# Patient Record
Sex: Male | Born: 1945 | Race: Black or African American | Hispanic: No | Marital: Married | Smoking: Former smoker
Health system: Southern US, Community
[De-identification: ages and names within clinical notes are randomized; demographics above are authoritative.]

## PROBLEM LIST (undated history)

## (undated) DIAGNOSIS — Z87891 Personal history of nicotine dependence: Secondary | ICD-10-CM

## (undated) DIAGNOSIS — M1710 Unilateral primary osteoarthritis, unspecified knee: Secondary | ICD-10-CM

## (undated) DIAGNOSIS — N529 Male erectile dysfunction, unspecified: Secondary | ICD-10-CM

## (undated) DIAGNOSIS — E119 Type 2 diabetes mellitus without complications: Secondary | ICD-10-CM

## (undated) DIAGNOSIS — Z860100 Personal history of colon polyps, unspecified: Secondary | ICD-10-CM

## (undated) DIAGNOSIS — J439 Emphysema, unspecified: Secondary | ICD-10-CM

## (undated) DIAGNOSIS — IMO0001 Reserved for inherently not codable concepts without codable children: Secondary | ICD-10-CM

## (undated) DIAGNOSIS — E1169 Type 2 diabetes mellitus with other specified complication: Secondary | ICD-10-CM

## (undated) DIAGNOSIS — Z531 Procedure and treatment not carried out because of patient's decision for reasons of belief and group pressure: Secondary | ICD-10-CM

## (undated) DIAGNOSIS — I2510 Atherosclerotic heart disease of native coronary artery without angina pectoris: Secondary | ICD-10-CM

## (undated) DIAGNOSIS — E785 Hyperlipidemia, unspecified: Secondary | ICD-10-CM

## (undated) DIAGNOSIS — I700 Atherosclerosis of aorta: Secondary | ICD-10-CM

## (undated) HISTORY — DX: Personal history of colonic polyps: Z86.010

## (undated) HISTORY — DX: Atherosclerosis of aorta: I70.0

## (undated) HISTORY — DX: Personal history of colon polyps, unspecified: Z86.0100

## (undated) HISTORY — DX: Personal history of nicotine dependence: Z87.891

## (undated) HISTORY — PX: EYE SURGERY: SHX253

## (undated) HISTORY — DX: Male erectile dysfunction, unspecified: N52.9

## (undated) HISTORY — DX: Hyperlipidemia, unspecified: E78.5

## (undated) HISTORY — DX: Emphysema, unspecified: J43.9

## (undated) HISTORY — DX: Reserved for inherently not codable concepts without codable children: IMO0001

## (undated) HISTORY — DX: Procedure and treatment not carried out because of patient's decision for reasons of belief and group pressure: Z53.1

## (undated) HISTORY — DX: Type 2 diabetes mellitus without complications: E11.9

## (undated) HISTORY — DX: Unilateral primary osteoarthritis, unspecified knee: M17.10

## (undated) HISTORY — DX: Atherosclerotic heart disease of native coronary artery without angina pectoris: I25.10

---

## 2003-11-11 HISTORY — PX: LIPOMA EXCISION: SHX5283

## 2007-11-11 HISTORY — PX: COLONOSCOPY: SHX174

## 2008-05-16 LAB — HM COLONOSCOPY

## 2011-11-11 HISTORY — PX: CARPAL TUNNEL RELEASE: SHX101

## 2012-11-10 HISTORY — PX: FOOT SURGERY: SHX648

## 2013-03-11 LAB — HEPATIC FUNCTION PANEL
ALT: 15 U/L (ref 10–40)
AST: 11 U/L — AB (ref 14–40)

## 2013-03-11 LAB — HEMOGLOBIN A1C: Hgb A1c MFr Bld: 6.7 % — AB (ref 4.0–6.0)

## 2013-11-10 DIAGNOSIS — M179 Osteoarthritis of knee, unspecified: Secondary | ICD-10-CM

## 2013-11-10 DIAGNOSIS — M1710 Unilateral primary osteoarthritis, unspecified knee: Secondary | ICD-10-CM

## 2013-11-10 HISTORY — DX: Unilateral primary osteoarthritis, unspecified knee: M17.10

## 2013-11-10 HISTORY — DX: Osteoarthritis of knee, unspecified: M17.9

## 2013-11-28 LAB — LIPID PANEL
CHOLESTEROL: 136 mg/dL (ref 0–200)
HDL: 44 mg/dL (ref 35–70)
LDL CALC: 74 mg/dL
TRIGLYCERIDES: 89 mg/dL (ref 40–160)

## 2013-11-28 LAB — CBC AND DIFFERENTIAL
HEMOGLOBIN: 14.1 g/dL (ref 13.5–17.5)
WBC: 6.7 10*3/mL

## 2013-11-28 LAB — BASIC METABOLIC PANEL
Creatinine: 1.2 mg/dL (ref <=1.3)
Glucose: 121 mg/dL
Potassium: 4.2 mmol/L (ref 3.4–5.3)

## 2014-07-19 ENCOUNTER — Encounter: Payer: Self-pay | Admitting: Family Medicine

## 2014-07-19 ENCOUNTER — Ambulatory Visit (INDEPENDENT_AMBULATORY_CARE_PROVIDER_SITE_OTHER): Payer: 59 | Admitting: Family Medicine

## 2014-07-19 VITALS — BP 132/78 | HR 68 | Temp 98.2°F | Ht 61.5 in | Wt 195.5 lb

## 2014-07-19 DIAGNOSIS — Z23 Encounter for immunization: Secondary | ICD-10-CM

## 2014-07-19 DIAGNOSIS — N529 Male erectile dysfunction, unspecified: Secondary | ICD-10-CM | POA: Insufficient documentation

## 2014-07-19 DIAGNOSIS — Z860100 Personal history of colon polyps, unspecified: Secondary | ICD-10-CM

## 2014-07-19 DIAGNOSIS — E1169 Type 2 diabetes mellitus with other specified complication: Secondary | ICD-10-CM | POA: Insufficient documentation

## 2014-07-19 DIAGNOSIS — E785 Hyperlipidemia, unspecified: Secondary | ICD-10-CM

## 2014-07-19 MED ORDER — SIMVASTATIN 20 MG PO TABS: 20.0000 mg | ORAL_TABLET | Freq: Every day | ORAL | Status: DC

## 2014-07-19 NOTE — Assessment & Plan Note (Signed)
Requests referral to GI for rpt colonoscopy - placed. ROI filled out for prior colonoscopy report

## 2014-07-19 NOTE — Assessment & Plan Note (Signed)
Doing well on viagra prn. Discussed try 1/2 tab prn.

## 2014-07-19 NOTE — Progress Notes (Signed)
Pre visit review using our clinic review tool, if applicable. No additional management support is needed unless otherwise documented below in the visit note. 

## 2014-07-19 NOTE — Patient Instructions (Signed)
Flu shot today. Good to meet you today, call us with questions. We will refer you for repeat colonoscopy - we will call you in next few weeks to set this up. Return as needed or in 4-5 months for next physical.

## 2014-07-19 NOTE — Assessment & Plan Note (Signed)
Tolerating simvastatin and omega 3 fish oil well - continue. FLP next fasting labwork for physical.

## 2014-07-19 NOTE — Addendum Note (Signed)
Addended by: Royann Shivers A on: 07/19/2014 11:55 AM   Modules accepted: Orders

## 2014-07-19 NOTE — Progress Notes (Signed)
BP 132/78  Pulse 68  Temp(Src) 98.2 F (36.8 C) (Oral)  Ht 5' 1.5" (1.562 m)  Wt 195 lb 8 oz (88.678 kg)  BMI 36.35 kg/m2   CC: new pt to establish  Subjective:    Patient ID: Edward Lester, male    DOB: 04-Oct-1946, 68 y.o.   MRN: [id number]  HPI: [name] is a 68 y.o. male presenting on 07/19/2014 for Establish Care   Prior saw MD in [place] (Dr Cephus Shelling), brings records of latest few visits.  HLD - on krill oil and simvastatin 20mg  nightly. H/o borderline cholesterol elevation. Has been on meds for last 5 years.  ED - viagra 100mg  working well.  Preventative: Colonoscopy - ~2009-2010, due for rpt per patient due to h/o colon polyps. No records of colonoscopy report in records he brings - will request again Flu shot - today Pneumovax - last year (?2 different ones) zostavax 2008 Td shot done unsure when.  Lives with wife Grown children Occupation: Printmaker rep - Education officer, museum Edu: college Activity: golfing Diet: good water, fruits/vegetables daily  Relevant past medical, surgical, family and social history reviewed and updated as indicated.  Allergies and medications reviewed and updated. No current outpatient prescriptions on file prior to visit.   No current facility-administered medications on file prior to visit.    Review of Systems Per HPI unless specifically indicated above    Objective:    BP 132/78  Pulse 68  Temp(Src) 98.2 F (36.8 C) (Oral)  Ht 5' 1.5" (1.562 m)  Wt 195 lb 8 oz (88.678 kg)  BMI 36.35 kg/m2  Physical Exam  Nursing note and vitals reviewed. Constitutional: He is oriented to person, place, and time. He appears well-developed and well-nourished. No distress.  HENT:  Head: Normocephalic and atraumatic.  Mouth/Throat: Uvula is midline, oropharynx is clear and moist and mucous membranes are normal. No oropharyngeal exudate, posterior oropharyngeal edema or posterior oropharyngeal erythema.  Eyes:  Conjunctivae and EOM are normal. Pupils are equal, round, and reactive to light. No scleral icterus.  Neck: Normal range of motion. Neck supple. No thyromegaly present.  Cardiovascular: Normal rate, regular rhythm, normal heart sounds and intact distal pulses.   No murmur heard. Pulses:      Radial pulses are 2+ on the right side, and 2+ on the left side.  Pulmonary/Chest: Effort normal and breath sounds normal. No respiratory distress. He has no wheezes. He has no rales.  Musculoskeletal: Normal range of motion. He exhibits no edema.  Lymphadenopathy:    He has no cervical adenopathy.  Neurological: He is alert and oriented to person, place, and time.  CN grossly intact, station and gait intact  Skin: Skin is warm and dry. No rash noted.  Psychiatric: He has a normal mood and affect. His behavior is normal. Judgment and thought content normal.   No results found for this or any previous visit.    Assessment & Plan:   Problem List Items Addressed This Visit   HLD (hyperlipidemia)     Tolerating simvastatin and omega 3 fish oil well - continue. FLP next fasting labwork for physical.    Relevant Medications      sildenafil (VIAGRA) 100 MG tablet      aspirin (ASPIRIN EC) 81 MG EC tablet      simvastatin (ZOCOR) tablet   History of colonic polyps     Requests referral to GI for rpt colonoscopy - placed. ROI filled out for prior colonoscopy  report    ED (erectile dysfunction) of organic origin     Doing well on viagra prn. Discussed try 1/2 tab prn.     Other Visit Diagnoses   Personal history of colonic polyps    -  Primary    Relevant Orders       Ambulatory referral to Gastroenterology        Follow up plan: Return in about 4 months (around 11/18/2014), or as needed, for physical.

## 2014-07-24 ENCOUNTER — Telehealth: Payer: Self-pay | Admitting: Internal Medicine

## 2014-07-25 ENCOUNTER — Encounter: Payer: Self-pay | Admitting: Family Medicine

## 2014-08-06 ENCOUNTER — Encounter: Payer: Self-pay | Admitting: Family Medicine

## 2014-08-06 LAB — PSA: PSA: 0.7

## 2014-08-14 ENCOUNTER — Encounter: Payer: Self-pay | Admitting: Internal Medicine

## 2014-08-14 NOTE — Telephone Encounter (Signed)
Pt had colon done 05/16/08, report scanned in epic. Per path report there was a hyperplastic polyp removed. Pt wants to know when he will need a recall colon done. Please advise.

## 2014-08-14 NOTE — Telephone Encounter (Signed)
Spoke with pt and he is aware. Pt scheduled to see Dr. Henrene Pastor 10/23/14@10 :15am. Pt aware of appt.

## 2014-08-14 NOTE — Telephone Encounter (Signed)
I don't believe that I have ever seen this patient? What was the followup recommendation from his previous endoscopist ([place])? Based on the limited available information, it looks like 2019. However, I don't know what type of polyps he had on examinations prior 2009. This may influence the followup interval. As well, is he having any symptoms? Does he have a family history? He is welcome to schedule an office appointment to discuss this with me further, if he wishes. Thanks

## 2014-08-14 NOTE — Telephone Encounter (Signed)
New Patient paperwork mailed.Edward Lester

## 2014-09-04 ENCOUNTER — Ambulatory Visit (INDEPENDENT_AMBULATORY_CARE_PROVIDER_SITE_OTHER): Payer: 59 | Admitting: Family Medicine

## 2014-09-04 ENCOUNTER — Encounter: Payer: Self-pay | Admitting: Family Medicine

## 2014-09-04 ENCOUNTER — Ambulatory Visit (INDEPENDENT_AMBULATORY_CARE_PROVIDER_SITE_OTHER)
Admission: RE | Admit: 2014-09-04 | Discharge: 2014-09-04 | Disposition: A | Discharge status: Home / Self Care | Payer: 59 | Source: Ambulatory Visit | Attending: Family Medicine | Admitting: Family Medicine

## 2014-09-04 VITALS — BP 110/70 | HR 68 | Temp 98.0°F | Wt 195.8 lb

## 2014-09-04 DIAGNOSIS — M25561 Pain in right knee: Secondary | ICD-10-CM

## 2014-09-04 DIAGNOSIS — M1711 Unilateral primary osteoarthritis, right knee: Secondary | ICD-10-CM | POA: Insufficient documentation

## 2014-09-04 MED ORDER — NAPROXEN 500 MG PO TABS: ORAL_TABLET | ORAL | Status: DC

## 2014-09-04 NOTE — Progress Notes (Signed)
Pre visit review using our clinic review tool, if applicable. No additional management support is needed unless otherwise documented below in the visit note. 

## 2014-09-04 NOTE — Assessment & Plan Note (Signed)
Anticipate longstanding degenerative arthritis of knee and patellofemoral space leading to intermittent effusion and baker's cyst. rec conservative treatment with NSAID, ice, elevation, brace and exercises from [facility] pt advisor provided today on PFPS. Baseline knee films - R complete and standing bilateral. Update if not improved with above for referral to SM. Pt agrees with plan.

## 2014-09-04 NOTE — Progress Notes (Signed)
   BP 110/70  Pulse 68  Temp(Src) 98 F (36.7 C) (Oral)  Wt 195 lb 12 oz (88.792 kg)   CC: knee pain  Subjective:    Patient ID: Edward Lester, male    DOB: 04-Jun-1946, 68 y.o.   MRN: [id number]  HPI: Edward Lester is a 67 y.o. male presenting on 09/04/2014 for Knee Pain   R knee swelling and pain for last 1 week, now better. Seems to intermittently swell limiting flexion of R knee over last 5-6 yrs. Also feels knot posterior R knee. Currently no pain. No other joint pain. No locking of knee. Denies recent inciting trauma/injury. Ankle doesn't swell, no calf pain.  15 yrs ago had R knee aspirated. No known h/o gout but this runs in the family. Does not describe gout pain. No redness or warmth of right knee.  Actually gave up basketball because of knee pain day after he played.  Relevant past medical, surgical, family and social history reviewed and updated as indicated.  Allergies and medications reviewed and updated. Current Outpatient Prescriptions on File Prior to Visit  Medication Sig  . aspirin (ASPIRIN EC) 81 MG EC tablet Take 81 mg by mouth daily. Swallow whole.  Astrid Drafts Ultra Strength 1500 MG CAPS Take 1,500 mg by mouth daily. MegaRed Omega  . sildenafil (VIAGRA) 100 MG tablet Take 100 mg by mouth as needed for erectile dysfunction.  . simvastatin (ZOCOR) 20 MG tablet Take 1 tablet (20 mg total) by mouth daily at 6 PM.   No current facility-administered medications on file prior to visit.    Review of Systems Per HPI unless specifically indicated above    Objective:    BP 110/70  Pulse 68  Temp(Src) 98 F (36.7 C) (Oral)  Wt 195 lb 12 oz (88.792 kg)  Physical Exam  Nursing note and vitals reviewed. Constitutional: He appears well-developed and well-nourished. No distress.  Musculoskeletal:  Left knee WNL Right Knee exam: No deformity on inspection. No pain with palpation of knee landmarks. Effusion R knee noted FROM in extension without crepitus, limited  flexion. + popliteal fullness. Neg drawer test. Neg mcmurray test. No pain with valgus/varus stress. + PF grind. No abnormal patellar mobility.       Assessment & Plan:   Problem List Items Addressed This Visit   Right knee pain - Primary     Anticipate longstanding degenerative arthritis of knee and patellofemoral space leading to intermittent effusion and baker's cyst. rec conservative treatment with NSAID, ice, elevation, brace and exercises from [facility] pt advisor provided today on PFPS. Baseline knee films - R complete and standing bilateral. Update if not improved with above for referral to SM. Pt agrees with plan.    Relevant Orders      DG Knee Complete 4 Views Right      DG Knee Bilateral Standing AP       Follow up plan: Return if symptoms worsen or fail to improve.

## 2014-09-04 NOTE — Patient Instructions (Addendum)
xrays of R knee today. I think you have some arthritis of knee itself as well as between knee and kneecap leading to fluid accumulation, and a baker's cyst behind the knee. Do exercises provided today for kneecap. We can try naprosyn 500mg  twice daily with food for 5 days whenever acute flare of swelling/pain, otherwise as needed. Ice knee, elevate knee when sitting at home. May use knee brace for further support. Let me know if worsening for referral to sports medicine doctor.

## 2014-09-05 ENCOUNTER — Telehealth: Payer: Self-pay | Admitting: Family Medicine

## 2014-09-05 ENCOUNTER — Encounter: Payer: Self-pay | Admitting: Family Medicine

## 2014-09-05 NOTE — Telephone Encounter (Signed)
Pt returned you call.

## 2014-09-05 NOTE — Telephone Encounter (Signed)
Spoke with patient.

## 2014-10-23 ENCOUNTER — Ambulatory Visit (INDEPENDENT_AMBULATORY_CARE_PROVIDER_SITE_OTHER): Payer: 59 | Admitting: Internal Medicine

## 2014-10-23 ENCOUNTER — Encounter: Payer: Self-pay | Admitting: Internal Medicine

## 2014-10-23 VITALS — BP 142/80 | HR 64 | Ht 70.0 in | Wt 200.4 lb

## 2014-10-23 DIAGNOSIS — Z86010 Personal history of colonic polyps: Secondary | ICD-10-CM

## 2014-10-23 NOTE — Patient Instructions (Signed)
You have a recall colonosocopy scheduled for 05/2018.  You will receive a letter ahead of time to remind you to call and schedule

## 2014-10-23 NOTE — Progress Notes (Signed)
HISTORY OF PRESENT ILLNESS:  Edward Lester is a 68 y.o. male who presents today to discuss the need for colonoscopy. Patient moved to [facility] about one year ago. He informed his primary care provider that he may be due for follow-up colonoscopy. I was able to receive an review outside records from [place] [facility] . Complete colonoscopy was performed 05/16/2008. The examination was complete to the cecum with all landmarks being identified and the colonic preparation good. Examination was normal except for a single diminutive polyp in the distal sigmoid colon which measured 2 mm and was found to be hyperplastic. Patient denies a family history of colon cancer. His GI review of systems is negative. He denies rectal bleeding, change in bowel habits, weight loss, or abdominal pain.  REVIEW OF SYSTEMS:  All non-GI ROS negative upon complete review  Past Medical History  Diagnosis Date  . HLD (hyperlipidemia)   . ED (erectile dysfunction) of organic origin   . History of colonic polyps   . Ex-smoker quit ~2005    30 PY hx  . Osteoarthritis of knee 2015    severe predominantly R  . Refusal of blood transfusions as patient is Jehovah's Witness     Past Surgical History  Procedure Laterality Date  . Foot surgery Left 2014    bone seperation on little toe  . Carpal tunnel release Left 2013  . Lipoma excision  2005    right back  . Colonoscopy  2009    1 hyperplastic polyp [facility])    Social History Edward Lester  reports that he quit smoking about 10 years ago. His smoking use included Cigarettes. He started smoking about 46 years ago. He has a 30 pack-year smoking history. He has never used smokeless tobacco. He reports that he drinks alcohol. He reports that he does not use illicit drugs.  family history includes Lung cancer (age of onset: 24) in his mother; Stomach cancer (age of onset: 95) in his father; Thyroid disease in his sister. There is no history of CAD, Stroke,  or Diabetes.  No Known Allergies     PHYSICAL EXAMINATION: Vital signs: BP 142/80 mmHg  Pulse 64  Ht 5\' 10"  (1.778 m)  Wt 200 lb 6 oz (90.89 kg)  BMI 28.75 kg/m2  Constitutional: generally well-appearing, no acute distress Psychiatric: alert and oriented x3, cooperative Eyes: extraocular movements intact, anicteric, conjunctiva pink Mouth: oral pharynx moist, no lesions Neck: supple no lymphadenopathy Cardiovascular: heart regular rate and rhythm, no murmur Lungs: clear to auscultation bilaterally Abdomen: soft, nontender, nondistended, no obvious ascites, no peritoneal signs, normal bowel sounds, no organomegaly Rectal: Omitted Extremities: no lower extremity edema bilaterally Skin: no lesions on visible extremities Neuro: No focal deficits.   ASSESSMENT:  #1. Index colonoscopy July 2009 with diminutive distal hyperplastic colon polyp only. Adequate complete exam. Baseline risk with no relevant GI symptoms presently   PLAN:  #1. I reviewed with the patient the current guidelines for colonoscopy follow-up intervals. As such, he is appropriate for follow-up July 2019. He understands that and examination may be required sooner for the interval development of relevant clinical issues. Otherwise, he'll return to the care of his primary care provider.

## 2014-11-11 ENCOUNTER — Other Ambulatory Visit: Payer: Self-pay | Admitting: Family Medicine

## 2014-11-11 ENCOUNTER — Encounter: Payer: Self-pay | Admitting: Family Medicine

## 2014-11-11 DIAGNOSIS — E119 Type 2 diabetes mellitus without complications: Secondary | ICD-10-CM

## 2014-11-11 DIAGNOSIS — E1136 Type 2 diabetes mellitus with diabetic cataract: Secondary | ICD-10-CM | POA: Insufficient documentation

## 2014-11-11 DIAGNOSIS — E1139 Type 2 diabetes mellitus with other diabetic ophthalmic complication: Secondary | ICD-10-CM | POA: Insufficient documentation

## 2014-11-11 DIAGNOSIS — E1169 Type 2 diabetes mellitus with other specified complication: Secondary | ICD-10-CM | POA: Insufficient documentation

## 2014-11-11 DIAGNOSIS — Z125 Encounter for screening for malignant neoplasm of prostate: Secondary | ICD-10-CM

## 2014-11-11 DIAGNOSIS — E785 Hyperlipidemia, unspecified: Secondary | ICD-10-CM

## 2014-11-11 HISTORY — DX: Type 2 diabetes mellitus without complications: E11.9

## 2014-11-13 ENCOUNTER — Other Ambulatory Visit (INDEPENDENT_AMBULATORY_CARE_PROVIDER_SITE_OTHER): Payer: 59

## 2014-11-13 DIAGNOSIS — Z125 Encounter for screening for malignant neoplasm of prostate: Secondary | ICD-10-CM

## 2014-11-13 DIAGNOSIS — E119 Type 2 diabetes mellitus without complications: Secondary | ICD-10-CM

## 2014-11-13 DIAGNOSIS — E785 Hyperlipidemia, unspecified: Secondary | ICD-10-CM

## 2014-11-13 LAB — COMPREHENSIVE METABOLIC PANEL
ALK PHOS: 60 U/L (ref 39–117)
ALT: 14 U/L (ref 0–53)
AST: 19 U/L (ref 0–37)
Albumin: 4 g/dL (ref 3.5–5.2)
BUN: 12 mg/dL (ref 6–23)
CHLORIDE: 109 meq/L (ref 96–112)
CO2: 24 mEq/L (ref 19–32)
Calcium: 9.1 mg/dL (ref 8.4–10.5)
Creatinine, Ser: 1 mg/dL (ref 0.4–1.5)
GFR: 94.66 mL/min (ref >60.00)
GLUCOSE: 153 mg/dL — AB (ref 70–99)
Potassium: 4 mEq/L (ref 3.5–5.1)
Sodium: 140 mEq/L (ref 135–145)
Total Bilirubin: 0.5 mg/dL (ref 0.2–1.2)
Total Protein: 7.1 g/dL (ref 6.0–8.3)

## 2014-11-13 LAB — HEMOGLOBIN A1C: Hgb A1c MFr Bld: 6.8 % — ABNORMAL HIGH (ref 4.6–6.5)

## 2014-11-13 LAB — MICROALBUMIN / CREATININE URINE RATIO
CREATININE, U: 236.4 mg/dL
MICROALB UR: 5.5 mg/dL — AB (ref 0.0–1.9)
Microalb Creat Ratio: 2.3 mg/g (ref 0.0–30.0)

## 2014-11-13 LAB — LIPID PANEL
CHOL/HDL RATIO: 4
Cholesterol: 150 mg/dL (ref 0–200)
HDL: 37.5 mg/dL — ABNORMAL LOW (ref >39.00)
LDL Cholesterol: 91 mg/dL (ref 0–99)
NonHDL: 112.5
Triglycerides: 107 mg/dL (ref 0.0–149.0)
VLDL: 21.4 mg/dL (ref 0.0–40.0)

## 2014-11-13 LAB — PSA, MEDICARE: PSA: 1.19 ng/ml (ref 0.10–4.00)

## 2014-11-13 NOTE — Addendum Note (Signed)
Addended by: Royann Shivers A on: 11/13/2014 03:05 PM   Modules accepted: Orders

## 2014-11-20 ENCOUNTER — Encounter: Payer: Self-pay | Admitting: Family Medicine

## 2014-11-20 ENCOUNTER — Ambulatory Visit (INDEPENDENT_AMBULATORY_CARE_PROVIDER_SITE_OTHER): Payer: 59 | Admitting: Family Medicine

## 2014-11-20 VITALS — BP 118/70 | HR 64 | Temp 97.9°F | Ht 70.5 in | Wt 196.2 lb

## 2014-11-20 DIAGNOSIS — Z7189 Other specified counseling: Secondary | ICD-10-CM | POA: Insufficient documentation

## 2014-11-20 DIAGNOSIS — Z0000 Encounter for general adult medical examination without abnormal findings: Secondary | ICD-10-CM | POA: Insufficient documentation

## 2014-11-20 DIAGNOSIS — E119 Type 2 diabetes mellitus without complications: Secondary | ICD-10-CM

## 2014-11-20 DIAGNOSIS — Z1211 Encounter for screening for malignant neoplasm of colon: Secondary | ICD-10-CM

## 2014-11-20 DIAGNOSIS — E785 Hyperlipidemia, unspecified: Secondary | ICD-10-CM

## 2014-11-20 DIAGNOSIS — N529 Male erectile dysfunction, unspecified: Secondary | ICD-10-CM

## 2014-11-20 DIAGNOSIS — M1711 Unilateral primary osteoarthritis, right knee: Secondary | ICD-10-CM

## 2014-11-20 MED ORDER — SILDENAFIL CITRATE 100 MG PO TABS: 100.0000 mg | ORAL_TABLET | ORAL | Status: DC | PRN

## 2014-11-20 MED ORDER — SIMVASTATIN 20 MG PO TABS
20.0000 mg | ORAL_TABLET | Freq: Every day | ORAL | Status: DC
Start: 2014-11-20 — End: 2014-11-20

## 2014-11-20 MED ORDER — SIMVASTATIN 20 MG PO TABS
20.0000 mg | ORAL_TABLET | Freq: Every day | ORAL | Status: DC
Start: 2014-11-20 — End: 2016-12-08

## 2014-11-20 NOTE — Assessment & Plan Note (Signed)
Advanced directive - has at home. Will bring me copy. Wife is HCPOA.

## 2014-11-20 NOTE — Assessment & Plan Note (Signed)
Stable without need for NSAID

## 2014-11-20 NOTE — Progress Notes (Signed)
Pre visit review using our clinic review tool, if applicable. No additional management support is needed unless otherwise documented below in the visit note. 

## 2014-11-20 NOTE — Assessment & Plan Note (Signed)
Refilled viagra

## 2014-11-20 NOTE — Progress Notes (Signed)
BP 118/70 mmHg  Pulse 64  Temp(Src) 97.9 F (36.6 C) (Oral)  Ht 5' 10.5" (1.791 m)  Wt 196 lb 4 oz (89.018 kg)  BMI 27.75 kg/m2   CC: CPE  Subjective:    Patient ID: Edward Lester, male    DOB: 1946/03/11, 69 y.o.   MRN: [id number]  HPI: Edward Lester is a 69 y.o. male presenting on 11/20/2014 for Annual Exam   Preventative: COLONOSCOPY Date: 2009 1 hyperplastic polyp [facility]). Requests iFOB for insurance purposes. Prostate cancer screening - discussed ,would like to screen yearly. Flu shot - 07/2014 Pneumovax - 11/28/2013, prevnar zostavax 2008 Td shot done unsure when.  Advanced directive - has at home. Will bring me copy. Wife is HCPOA.   Lives with wife Grown children Jehova's witness Occupation: Printmaker rep - Education officer, museum Edu: college Activity: golfing Diet: good water, fruits/vegetables daily  Relevant past medical, surgical, family and social history reviewed and updated as indicated. Interim medical history since our last visit reviewed. Allergies and medications reviewed and updated. Current Outpatient Prescriptions on File Prior to Visit  Medication Sig  . aspirin (ASPIRIN EC) 81 MG EC tablet Take 81 mg by mouth daily. Swallow whole.  Astrid Drafts Ultra Strength 1500 MG CAPS Take 1,500 mg by mouth daily. MegaRed Omega   No current facility-administered medications on file prior to visit.    Review of Systems  Constitutional: Negative for fever, chills, activity change, appetite change, fatigue and unexpected weight change.  HENT: Negative for hearing loss.   Eyes: Negative for visual disturbance.  Respiratory: Negative for cough, chest tightness, shortness of breath and wheezing.   Cardiovascular: Negative for chest pain, palpitations and leg swelling.  Gastrointestinal: Negative for nausea, vomiting, abdominal pain, diarrhea, constipation, blood in stool and abdominal distention.  Genitourinary: Negative for hematuria and difficulty  urinating.  Musculoskeletal: Negative for myalgias, arthralgias and neck pain.  Skin: Negative for rash.  Neurological: Negative for dizziness, seizures, syncope and headaches.  Hematological: Negative for adenopathy. Does not bruise/bleed easily.  Psychiatric/Behavioral: Negative for dysphoric mood. The patient is not nervous/anxious.    Per HPI unless specifically indicated above     Objective:    BP 118/70 mmHg  Pulse 64  Temp(Src) 97.9 F (36.6 C) (Oral)  Ht 5' 10.5" (1.791 m)  Wt 196 lb 4 oz (89.018 kg)  BMI 27.75 kg/m2  Wt Readings from Last 3 Encounters:  11/20/14 196 lb 4 oz (89.018 kg)  10/23/14 200 lb 6 oz (90.89 kg)  09/04/14 195 lb 12 oz (88.792 kg)    Physical Exam  Constitutional: He is oriented to person, place, and time. He appears well-developed and well-nourished. No distress.  HENT:  Head: Normocephalic and atraumatic.  Right Ear: Hearing, tympanic membrane, external ear and ear canal normal.  Left Ear: Hearing, tympanic membrane, external ear and ear canal normal.  Nose: Nose normal.  Mouth/Throat: Uvula is midline, oropharynx is clear and moist and mucous membranes are normal. No oropharyngeal exudate, posterior oropharyngeal edema or posterior oropharyngeal erythema.  Eyes: Conjunctivae and EOM are normal. Pupils are equal, round, and reactive to light. No scleral icterus.  Neck: Normal range of motion. Neck supple. Carotid bruit is not present. No thyromegaly present.  Cardiovascular: Normal rate, regular rhythm, normal heart sounds and intact distal pulses.   No murmur heard. Pulses:      Radial pulses are 2+ on the right side, and 2+ on the left side.  Pulmonary/Chest: Effort normal and breath  sounds normal. No respiratory distress. He has no wheezes. He has no rales.  Abdominal: Soft. Bowel sounds are normal. He exhibits no distension and no mass. There is no tenderness. There is no rebound and no guarding.  Genitourinary: Rectum normal and prostate  normal. Rectal exam shows no external hemorrhoid, no internal hemorrhoid, no fissure, no mass, no tenderness and anal tone normal. Prostate is not enlarged (15gm) and not tender.  Musculoskeletal: Normal range of motion. He exhibits no edema.  Lymphadenopathy:    He has no cervical adenopathy.  Neurological: He is alert and oriented to person, place, and time.  CN grossly intact, station and gait intact  Skin: Skin is warm and dry. No rash noted.  Psychiatric: He has a normal mood and affect. His behavior is normal. Judgment and thought content normal.  Nursing note and vitals reviewed.  Results for orders placed or performed in visit on 11/13/14  Lipid panel  Result Value Ref Range   Cholesterol 150 0 - 200 mg/dL   Triglycerides 107.0 0.0 - 149.0 mg/dL   HDL 37.50 (L) >39.00 mg/dL   VLDL 21.4 0.0 - 40.0 mg/dL   LDL Cholesterol 91 0 - 99 mg/dL   Total CHOL/HDL Ratio 4    NonHDL 112.50   Comprehensive metabolic panel  Result Value Ref Range   Sodium 140 135 - 145 mEq/L   Potassium 4.0 3.5 - 5.1 mEq/L   Chloride 109 96 - 112 mEq/L   CO2 24 19 - 32 mEq/L   Glucose, Bld 153 (H) 70 - 99 mg/dL   BUN 12 6 - 23 mg/dL   Creatinine, Ser 1.0 0.4 - 1.5 mg/dL   Total Bilirubin 0.5 0.2 - 1.2 mg/dL   Alkaline Phosphatase 60 39 - 117 U/L   AST 19 0 - 37 U/L   ALT 14 0 - 53 U/L   Total Protein 7.1 6.0 - 8.3 g/dL   Albumin 4.0 3.5 - 5.2 g/dL   Calcium 9.1 8.4 - 10.5 mg/dL   GFR 94.66 >60.00 mL/min  Hemoglobin A1c  Result Value Ref Range   Hgb A1c MFr Bld 6.8 (H) 4.6 - 6.5 %  PSA, Medicare  Result Value Ref Range   PSA 1.19 0.10 - 4.00 ng/ml  Microalbumin / creatinine urine ratio  Result Value Ref Range   Microalb, Ur 5.5 (H) 0.0 - 1.9 mg/dL   Creatinine,U 236.4 mg/dL   Microalb Creat Ratio 2.3 0.0 - 30.0 mg/g      Assessment & Plan:   Problem List Items Addressed This Visit    Osteoarthritis of right knee    Stable without need for NSAID    HLD (hyperlipidemia)    Reviewed with  patient - tolerating simvastatin and fish oil well Continue current regimen.    Relevant Medications      sildenafil (VIAGRA) tablet      simvastatin (ZOCOR) tablet   Health care maintenance - Primary    Preventative protocols reviewed and updated unless pt declined. Discussed healthy diet and lifestyle.     ED (erectile dysfunction) of organic origin    Refilled viagra.    Diabetes mellitus type 2, controlled, without complications    Reviewed diet controlled dm diagnosis - foot exam today  Pt will return next month for prevnar. rtc 6 mo for DM f/u DM in diet handout provided.    Relevant Medications      simvastatin (ZOCOR) tablet   Advanced care planning/counseling discussion    Advanced directive -  has at home. Will bring me copy. Wife is HCPOA.      Other Visit Diagnoses    Special screening for malignant neoplasms, colon        Relevant Orders       Fecal occult blood, imunochemical        Follow up plan: Return in about 6 months (around 05/21/2015), or as needed, for follow up visit.

## 2014-11-20 NOTE — Assessment & Plan Note (Signed)
Preventative protocols reviewed and updated unless pt declined. Discussed healthy diet and lifestyle.  

## 2014-11-20 NOTE — Assessment & Plan Note (Signed)
Reviewed with patient - tolerating simvastatin and fish oil well Continue current regimen.

## 2014-11-20 NOTE — Assessment & Plan Note (Addendum)
Reviewed diet controlled dm diagnosis - foot exam today  Pt will return next month for prevnar. rtc 6 mo for DM f/u DM in diet handout provided.

## 2014-11-20 NOTE — Patient Instructions (Addendum)
Pass by lab to pick up stool kit. Good to see you today, call us with questions. Return next month for prevnar (2nd and final pneumonia shot). Bring me a copy of your living will for your chart. Return as needed or in 6 months for diabetes follow up Watch sugars for diet-controlled diabetes  Diabetes Mellitus and Food It is important for you to manage your blood sugar (glucose) level. Your blood glucose level can be greatly affected by what you eat. Eating healthier foods in the appropriate amounts throughout the day at about the same time each day will help you control your blood glucose level. It can also help slow or prevent worsening of your diabetes mellitus. Healthy eating may even help you improve the level of your blood pressure and reach or maintain a healthy weight.  HOW CAN FOOD AFFECT ME? Carbohydrates Carbohydrates affect your blood glucose level more than any other type of food. Your dietitian will help you determine how many carbohydrates to eat at each meal and teach you how to count carbohydrates. Counting carbohydrates is important to keep your blood glucose at a healthy level, especially if you are using insulin or taking certain medicines for diabetes mellitus. Alcohol Alcohol can cause sudden decreases in blood glucose (hypoglycemia), especially if you use insulin or take certain medicines for diabetes mellitus. Hypoglycemia can be a life-threatening condition. Symptoms of hypoglycemia (sleepiness, dizziness, and disorientation) are similar to symptoms of having too much alcohol.  If your health care provider has given you approval to drink alcohol, do so in moderation and use the following guidelines:  Women should not have more than one drink per day, and men should not have more than two drinks per day. One drink is equal to:  12 oz of beer.  5 oz of wine.  1 oz of hard liquor.  Do not drink on an empty stomach.  Keep yourself hydrated. Have water, diet soda, or  unsweetened iced tea.  Regular soda, juice, and other mixers might contain a lot of carbohydrates and should be counted. WHAT FOODS ARE NOT RECOMMENDED? As you make food choices, it is important to remember that all foods are not the same. Some foods have fewer nutrients per serving than other foods, even though they might have the same number of calories or carbohydrates. It is difficult to get your body what it needs when you eat foods with fewer nutrients. Examples of foods that you should avoid that are high in calories and carbohydrates but low in nutrients include:  Trans fats (most processed foods list trans fats on the Nutrition Facts label).  Regular soda.  Juice.  Candy.  Sweets, such as cake, pie, doughnuts, and cookies.  Fried foods. WHAT FOODS CAN I EAT? Have nutrient-rich foods, which will nourish your body and keep you healthy. The food you should eat also will depend on several factors, including:  The calories you need.  The medicines you take.  Your weight.  Your blood glucose level.  Your blood pressure level.  Your cholesterol level. You also should eat a variety of foods, including:  Protein, such as meat, poultry, fish, tofu, nuts, and seeds (lean animal proteins are best).  Fruits.  Vegetables.  Dairy products, such as milk, cheese, and yogurt (low fat is best).  Breads, grains, pasta, cereal, rice, and beans.  Fats such as olive oil, trans fat-free margarine, canola oil, avocado, and olives. DOES EVERYONE WITH DIABETES MELLITUS HAVE THE SAME MEAL PLAN? Because every person  with diabetes mellitus is different, there is not one meal plan that works for everyone. It is very important that you meet with a dietitian who will help you create a meal plan that is just right for you. Document Released: 07/24/2005 Document Revised: 11/01/2013 Document Reviewed: 09/23/2013 [facility] Patient Information 2015 [place], [place]. This information is not intended  to replace advice given to you by your health care provider. Make sure you discuss any questions you have with your health care provider.

## 2014-11-23 ENCOUNTER — Other Ambulatory Visit: Payer: 59

## 2014-11-23 DIAGNOSIS — Z1211 Encounter for screening for malignant neoplasm of colon: Secondary | ICD-10-CM

## 2014-11-23 LAB — FECAL OCCULT BLOOD, GUAIAC: FECAL OCCULT BLD: NEGATIVE

## 2014-11-23 LAB — FECAL OCCULT BLOOD, IMMUNOCHEMICAL: Fecal Occult Bld: NEGATIVE

## 2014-11-24 ENCOUNTER — Encounter: Payer: Self-pay | Admitting: Family Medicine

## 2014-11-24 ENCOUNTER — Encounter: Payer: Self-pay | Admitting: *Deleted

## 2015-05-09 ENCOUNTER — Other Ambulatory Visit: Payer: Self-pay | Admitting: Family Medicine

## 2015-05-09 ENCOUNTER — Encounter (INDEPENDENT_AMBULATORY_CARE_PROVIDER_SITE_OTHER): Payer: Self-pay

## 2015-05-09 ENCOUNTER — Other Ambulatory Visit (INDEPENDENT_AMBULATORY_CARE_PROVIDER_SITE_OTHER): Payer: 59

## 2015-05-09 DIAGNOSIS — E119 Type 2 diabetes mellitus without complications: Secondary | ICD-10-CM

## 2015-05-09 LAB — BASIC METABOLIC PANEL
BUN: 14 mg/dL (ref 6–23)
CALCIUM: 8.9 mg/dL (ref 8.4–10.5)
CO2: 25 mEq/L (ref 19–32)
Chloride: 105 mEq/L (ref 96–112)
Creatinine, Ser: 1 mg/dL (ref 0.40–1.50)
GFR: 95.61 mL/min (ref >60.00)
GLUCOSE: 115 mg/dL — AB (ref 70–99)
Potassium: 4.1 mEq/L (ref 3.5–5.1)
Sodium: 137 mEq/L (ref 135–145)

## 2015-05-09 LAB — HEMOGLOBIN A1C: Hgb A1c MFr Bld: 6.3 % (ref 4.6–6.5)

## 2015-05-16 ENCOUNTER — Encounter: Payer: Self-pay | Admitting: Family Medicine

## 2015-05-16 ENCOUNTER — Other Ambulatory Visit: Payer: Self-pay | Admitting: Family Medicine

## 2015-05-16 ENCOUNTER — Ambulatory Visit (INDEPENDENT_AMBULATORY_CARE_PROVIDER_SITE_OTHER): Payer: 59 | Admitting: Family Medicine

## 2015-05-16 VITALS — BP 128/82 | HR 68 | Temp 98.2°F | Wt 186.5 lb

## 2015-05-16 DIAGNOSIS — Z1159 Encounter for screening for other viral diseases: Secondary | ICD-10-CM | POA: Diagnosis not present

## 2015-05-16 DIAGNOSIS — E785 Hyperlipidemia, unspecified: Secondary | ICD-10-CM

## 2015-05-16 DIAGNOSIS — Z23 Encounter for immunization: Secondary | ICD-10-CM

## 2015-05-16 DIAGNOSIS — R7303 Prediabetes: Secondary | ICD-10-CM

## 2015-05-16 DIAGNOSIS — R7309 Other abnormal glucose: Secondary | ICD-10-CM

## 2015-05-16 NOTE — Addendum Note (Signed)
Addended by: Ellamae Sia on: 05/16/2015 11:19 AM   Modules accepted: Orders

## 2015-05-16 NOTE — Addendum Note (Signed)
Addended by: Daralene Milch C on: 05/16/2015 10:03 AM   Modules accepted: Orders

## 2015-05-16 NOTE — Assessment & Plan Note (Signed)
Actually with healthy changes and weight loss no longer diabetic - actually prediabetes now. Discussed and congratulated.

## 2015-05-16 NOTE — Progress Notes (Signed)
Pre visit review using our clinic review tool, if applicable. No additional management support is needed unless otherwise documented below in the visit note. 

## 2015-05-16 NOTE — Addendum Note (Signed)
Addended by: Royann Shivers A on: 05/16/2015 10:13 AM   Modules accepted: Orders

## 2015-05-16 NOTE — Assessment & Plan Note (Signed)
Chronic, stable. Continue krill oil and simvastatin.

## 2015-05-16 NOTE — Progress Notes (Signed)
BP 128/82 mmHg  Pulse 68  Temp(Src) 98.2 F (36.8 C) (Oral)  Wt 186 lb 8 oz (84.596 kg)   CC: 6 mo f/u visit  Subjective:    Patient ID: Edward Lester, male    DOB: [birth year], 69 y.o.   MRN: [id number]  HPI: Edward Lester is a 69 y.o. male presenting on 05/16/2015 for Follow-up   Yesterday bp up to 384 systolic. Then dropped to 129. This was after sister aggravated him. Associated with headache and vomiting.   10 lb weight loss - increased exercise and healthy diet changes.  HLD - stable on simvastatin and krill oil.  DM - regularly does not check sugars. Compliant with antihyperglycemic regimen which includes: diet controlled.  Actually now in prediabetes range! Denies hypoglycemic symptoms.  Denies paresthesias. Occasional left pinky numbness. Last diabetic eye exam DUE.  Pneumovax: 2009, 2015.  Prevnar: today. Lab Results  Component Value Date   HGBA1C 6.3 05/09/2015   Diabetic Foot Exam - Simple   Simple Foot Form  Diabetic Foot exam was performed with the following findings:  Yes 05/16/2015  9:49 AM  Visual Inspection  No deformities, no ulcerations, no other skin breakdown bilaterally:  Yes  Sensation Testing  Intact to touch and monofilament testing bilaterally:  Yes  Pulse Check  Posterior Tibialis and Dorsalis pulse intact bilaterally:  Yes  Comments      Requests hep C screen today  Relevant past medical, surgical, family and social history reviewed and updated as indicated. Interim medical history since our last visit reviewed. Allergies and medications reviewed and updated. Current Outpatient Prescriptions on File Prior to Visit  Medication Sig  . aspirin (ASPIRIN EC) 81 MG EC tablet Take 81 mg by mouth daily. Swallow whole.  Astrid Drafts Ultra Strength 1500 MG CAPS Take 1,500 mg by mouth daily. MegaRed Omega  . sildenafil (VIAGRA) 100 MG tablet Take 1 tablet (100 mg total) by mouth as needed for erectile dysfunction.  . simvastatin (ZOCOR) 20 MG tablet Take 1  tablet (20 mg total) by mouth daily at 6 PM.   No current facility-administered medications on file prior to visit.    Review of Systems Per HPI unless specifically indicated above     Objective:    BP 128/82 mmHg  Pulse 68  Temp(Src) 98.2 F (36.8 C) (Oral)  Wt 186 lb 8 oz (84.596 kg)  Wt Readings from Last 3 Encounters:  05/16/15 186 lb 8 oz (84.596 kg)  11/20/14 196 lb 4 oz (89.018 kg)  10/23/14 200 lb 6 oz (90.89 kg)   Body mass index is 26.37 kg/(m^2).  Physical Exam  Constitutional: He appears well-developed and well-nourished. No distress.  HENT:  Head: Normocephalic and atraumatic.  Right Ear: External ear normal.  Left Ear: External ear normal.  Nose: Nose normal.  Mouth/Throat: Oropharynx is clear and moist. No oropharyngeal exudate.  Eyes: Conjunctivae and EOM are normal. Pupils are equal, round, and reactive to light. No scleral icterus.  Neck: Normal range of motion. Neck supple.  Cardiovascular: Normal rate, regular rhythm, normal heart sounds and intact distal pulses.   No murmur heard. Pulmonary/Chest: Effort normal and breath sounds normal. No respiratory distress. He has no wheezes. He has no rales.  Musculoskeletal: He exhibits no edema.  See HPI for foot exam if done  Lymphadenopathy:    He has no cervical adenopathy.  Skin: Skin is warm and dry. No rash noted.  Psychiatric: He has a normal mood and affect.  Nursing note and vitals reviewed.  Results for orders placed or performed in visit on 05/09/15  Hemoglobin A1c  Result Value Ref Range   Hgb A1c MFr Bld 6.3 4.6 - 6.5 %  Basic metabolic panel  Result Value Ref Range   Sodium 137 135 - 145 mEq/L   Potassium 4.1 3.5 - 5.1 mEq/L   Chloride 105 96 - 112 mEq/L   CO2 25 19 - 32 mEq/L   Glucose, Bld 115 (H) 70 - 99 mg/dL   BUN 14 6 - 23 mg/dL   Creatinine, Ser 1.00 0.40 - 1.50 mg/dL   Calcium 8.9 8.4 - 10.5 mg/dL   GFR 95.61 >60.00 mL/min      Assessment & Plan:   Problem List Items  Addressed This Visit    HLD (hyperlipidemia)    Chronic, stable. Continue krill oil and simvastatin.      Prediabetes    Actually with healthy changes and weight loss no longer diabetic - actually prediabetes now. Discussed and congratulated.       Other Visit Diagnoses    Need for hepatitis C screening test    -  Primary    Relevant Orders    Hepatitis C Ab Reflex HCV RNA, QUANT        Follow up plan: Return in about 6 months (around 11/16/2015), or as needed, for annual exam, prior fasting for blood work.

## 2015-05-16 NOTE — Patient Instructions (Addendum)
prevnar today. Hep C screen today. Let's check blood pressures a few times each week and let me know if consistently >140/90.  Return in 6 months for physical.

## 2015-05-17 LAB — HEPATITIS C ANTIBODY: HCV AB: NEGATIVE

## 2015-07-20 ENCOUNTER — Telehealth: Payer: Self-pay

## 2015-07-20 NOTE — Telephone Encounter (Signed)
Pt wants to know if can get biometric form filled out from 05/2015 visit. Pt will bring form to office to see if info requested on form is in pts chart.

## 2015-11-26 ENCOUNTER — Other Ambulatory Visit: Payer: Self-pay | Admitting: Family Medicine

## 2015-11-26 DIAGNOSIS — Z125 Encounter for screening for malignant neoplasm of prostate: Secondary | ICD-10-CM

## 2015-11-26 DIAGNOSIS — E785 Hyperlipidemia, unspecified: Secondary | ICD-10-CM

## 2015-11-26 DIAGNOSIS — R7303 Prediabetes: Secondary | ICD-10-CM

## 2015-11-27 ENCOUNTER — Other Ambulatory Visit (INDEPENDENT_AMBULATORY_CARE_PROVIDER_SITE_OTHER): Payer: 59

## 2015-11-27 DIAGNOSIS — E785 Hyperlipidemia, unspecified: Secondary | ICD-10-CM | POA: Diagnosis not present

## 2015-11-27 DIAGNOSIS — Z125 Encounter for screening for malignant neoplasm of prostate: Secondary | ICD-10-CM

## 2015-11-27 DIAGNOSIS — R7303 Prediabetes: Secondary | ICD-10-CM

## 2015-11-27 LAB — BASIC METABOLIC PANEL
BUN: 11 mg/dL (ref 6–23)
CALCIUM: 8.8 mg/dL (ref 8.4–10.5)
CHLORIDE: 107 meq/L (ref 96–112)
CO2: 25 meq/L (ref 19–32)
CREATININE: 0.94 mg/dL (ref 0.40–1.50)
GFR: 102.52 mL/min (ref >60.00)
Glucose, Bld: 125 mg/dL — ABNORMAL HIGH (ref 70–99)
Potassium: 4.2 mEq/L (ref 3.5–5.1)
Sodium: 140 mEq/L (ref 135–145)

## 2015-11-27 LAB — LIPID PANEL
CHOLESTEROL: 163 mg/dL (ref 0–200)
HDL: 39.7 mg/dL (ref >39.00)
LDL Cholesterol: 104 mg/dL — ABNORMAL HIGH (ref 0–99)
NonHDL: 123.45
Total CHOL/HDL Ratio: 4
Triglycerides: 99 mg/dL (ref 0.0–149.0)
VLDL: 19.8 mg/dL (ref 0.0–40.0)

## 2015-11-27 LAB — HEMOGLOBIN A1C: Hgb A1c MFr Bld: 6.5 % (ref 4.6–6.5)

## 2015-11-27 LAB — PSA, MEDICARE: PSA: 0.76 ng/ml (ref 0.10–4.00)

## 2015-11-28 ENCOUNTER — Other Ambulatory Visit: Payer: 59

## 2015-12-03 ENCOUNTER — Ambulatory Visit (INDEPENDENT_AMBULATORY_CARE_PROVIDER_SITE_OTHER): Payer: 59 | Admitting: Family Medicine

## 2015-12-03 ENCOUNTER — Encounter: Payer: Self-pay | Admitting: Family Medicine

## 2015-12-03 VITALS — BP 136/84 | HR 64 | Temp 98.3°F | Wt 191.5 lb

## 2015-12-03 DIAGNOSIS — E119 Type 2 diabetes mellitus without complications: Secondary | ICD-10-CM | POA: Diagnosis not present

## 2015-12-03 DIAGNOSIS — Z0000 Encounter for general adult medical examination without abnormal findings: Secondary | ICD-10-CM

## 2015-12-03 DIAGNOSIS — Z23 Encounter for immunization: Secondary | ICD-10-CM | POA: Diagnosis not present

## 2015-12-03 DIAGNOSIS — E785 Hyperlipidemia, unspecified: Secondary | ICD-10-CM

## 2015-12-03 DIAGNOSIS — Z7189 Other specified counseling: Secondary | ICD-10-CM

## 2015-12-03 LAB — MICROALBUMIN / CREATININE URINE RATIO
Creatinine,U: 136 mg/dL
MICROALB UR: 0.8 mg/dL (ref 0.0–1.9)
Microalb Creat Ratio: 0.6 mg/g (ref 0.0–30.0)

## 2015-12-03 NOTE — Progress Notes (Signed)
Pre visit review using our clinic review tool, if applicable. No additional management support is needed unless otherwise documented below in the visit note. 

## 2015-12-03 NOTE — Progress Notes (Signed)
BP 136/84 mmHg  Pulse 64  Temp(Src) 98.3 F (36.8 C) (Oral)  Wt 191 lb 8 oz (86.864 kg)   CC: medicare wellness  Subjective:    Patient ID: Edward Lester, male    DOB: [birth year], 70 y.o.   MRN: [id number]  HPI: Edward Lester is a 70 y.o. male presenting on 12/03/2015 for Annual Exam   Diabetic Foot Exam - Simple   Simple Foot Form  Diabetic Foot exam was performed with the following findings:  Yes 12/03/2015  2:41 PM  Visual Inspection  No deformities, no ulcerations, no other skin breakdown bilaterally:  Yes  Sensation Testing  Intact to touch and monofilament testing bilaterally:  Yes  Pulse Check  Posterior Tibialis and Dorsalis pulse intact bilaterally:  Yes  Comments  Some skin breakdown between 4th/5th toes L side      Preventative: COLONOSCOPY Date: 2009 1 hyperplastic polyp [facility]). iFOB normal 2017. Prostate cancer screening - discussed ,would like to screen yearly. Mild BPH on exam Flu shot - yearly Td shot done unsure when.  Pneumovax - 11/28/2013, prevnar 05/2015 zostavax 2008 Advanced directive - has at home. Will bring me copy. Wife is HCPOA.  Seat belt use discussed No changing moles on skin. Yearly eye exam pending this month  Lives with wife Grown children Jehova's witness Occupation: Printmaker rep - Education officer, [employer] Edu: college Activity: golfing Diet: good water, fruits/vegetables daily  Relevant past medical, surgical, family and social history reviewed and updated as indicated. Interim medical history since our last visit reviewed. Allergies and medications reviewed and updated. Current Outpatient Prescriptions on File Prior to Visit  Medication Sig  . Astrid Drafts Ultra Strength 1500 MG CAPS Take 1,500 mg by mouth daily. MegaRed Omega  . sildenafil (VIAGRA) 100 MG tablet Take 1 tablet (100 mg total) by mouth as needed for erectile dysfunction.  . simvastatin (ZOCOR) 20 MG tablet Take 1 tablet (20 mg total) by mouth daily at  6 PM.  . aspirin (ASPIRIN EC) 81 MG EC tablet Take 81 mg by mouth daily. Reported on 12/03/2015   No current facility-administered medications on file prior to visit.    Review of Systems  Constitutional: Negative for fever, chills, activity change, appetite change, fatigue and unexpected weight change.  HENT: Positive for congestion. Negative for hearing loss.   Eyes: Negative for visual disturbance.  Respiratory: Positive for cough. Negative for chest tightness, shortness of breath and wheezing.   Cardiovascular: Negative for chest pain, palpitations and leg swelling.  Gastrointestinal: Negative for nausea, vomiting, abdominal pain, diarrhea, constipation, blood in stool and abdominal distention.  Genitourinary: Negative for hematuria and difficulty urinating.  Musculoskeletal: Negative for myalgias, arthralgias and neck pain.  Skin: Negative for rash.  Neurological: Negative for dizziness, seizures, syncope and headaches.  Hematological: Negative for adenopathy. Does not bruise/bleed easily.  Psychiatric/Behavioral: Negative for dysphoric mood. The patient is not nervous/anxious.    Per HPI unless specifically indicated in ROS section     Objective:    BP 136/84 mmHg  Pulse 64  Temp(Src) 98.3 F (36.8 C) (Oral)  Wt 191 lb 8 oz (86.864 kg)  Wt Readings from Last 3 Encounters:  12/03/15 191 lb 8 oz (86.864 kg)  05/16/15 186 lb 8 oz (84.596 kg)  11/20/14 196 lb 4 oz (89.018 kg)    Physical Exam  Constitutional: He is oriented to person, place, and time. He appears well-developed and well-nourished. No distress.  HENT:  Head: Normocephalic and atraumatic.  Right  Ear: Hearing, tympanic membrane, external ear and ear canal normal.  Left Ear: Hearing, tympanic membrane, external ear and ear canal normal.  Nose: Nose normal.  Mouth/Throat: Uvula is midline, oropharynx is clear and moist and mucous membranes are normal. No oropharyngeal exudate, posterior oropharyngeal edema or  posterior oropharyngeal erythema.  Eyes: Conjunctivae and EOM are normal. Pupils are equal, round, and reactive to light. No scleral icterus.  Neck: Normal range of motion. Neck supple. Carotid bruit is not present. No thyromegaly present.  Cardiovascular: Normal rate, regular rhythm, normal heart sounds and intact distal pulses.   No murmur heard. Pulses:      Radial pulses are 2+ on the right side, and 2+ on the left side.  Pulmonary/Chest: Effort normal and breath sounds normal. No respiratory distress. He has no wheezes. He has no rales.  Abdominal: Soft. Bowel sounds are normal. He exhibits no distension and no mass. There is no tenderness. There is no rebound and no guarding.  Genitourinary: Rectum normal. Rectal exam shows no external hemorrhoid, no internal hemorrhoid, no fissure, no mass, no tenderness and anal tone normal. Prostate is enlarged (25gm). Prostate is not tender.  Musculoskeletal: Normal range of motion. He exhibits no edema.  See HPI for foot exam if done  Lymphadenopathy:    He has no cervical adenopathy.  Neurological: He is alert and oriented to person, place, and time.  CN grossly intact, station and gait intact  Skin: Skin is warm and dry. No rash noted.  Psychiatric: He has a normal mood and affect. His behavior is normal. Judgment and thought content normal.  Nursing note and vitals reviewed.  Results for orders placed or performed in visit on 11/27/15  Lipid panel  Result Value Ref Range   Cholesterol 163 0 - 200 mg/dL   Triglycerides 99.0 0.0 - 149.0 mg/dL   HDL 39.70 >39.00 mg/dL   VLDL 19.8 0.0 - 40.0 mg/dL   LDL Cholesterol 104 (H) 0 - 99 mg/dL   Total CHOL/HDL Ratio 4    NonHDL [id number]   Basic metabolic panel  Result Value Ref Range   Sodium 140 135 - 145 mEq/L   Potassium 4.2 3.5 - 5.1 mEq/L   Chloride 107 96 - 112 mEq/L   CO2 25 19 - 32 mEq/L   Glucose, Bld 125 (H) 70 - 99 mg/dL   BUN 11 6 - 23 mg/dL   Creatinine, Ser 0.94 0.40 - 1.50 mg/dL     Calcium 8.8 8.4 - 10.5 mg/dL   GFR 102.52 >60.00 mL/min  Hemoglobin A1c  Result Value Ref Range   Hgb A1c MFr Bld 6.5 4.6 - 6.5 %  PSA, Medicare  Result Value Ref Range   PSA 0.76 0.10 - 4.00 ng/ml      Assessment & Plan:   Problem List Items Addressed This Visit    HLD (hyperlipidemia)    Chronic, stable. Continue current regimen of simvastatin and krill.      Health care maintenance - Primary    Preventative protocols reviewed and updated unless pt declined. Discussed healthy diet and lifestyle.       Diabetes mellitus type 2, diet-controlled ([place])    Foot exam today. Upcoming eye exam. Will refer to diabetes education.      Relevant Orders   Ambulatory referral to diabetic education   Advanced care planning/counseling discussion    Advanced directive - has at home. Will bring me copy. Wife is HCPOA.        Other  Visit Diagnoses    Need for influenza vaccination        Relevant Orders    Flu Vaccine QUAD 36+ mos PF IM (Fluarix & Fluzone Quad PF) (Completed)        Follow up plan: Return in about 1 year (around 12/02/2016), or as needed, for annual exam, prior fasting for blood work.

## 2015-12-03 NOTE — Addendum Note (Signed)
Addended by: Ellamae Sia on: 12/03/2015 02:55 PM   Modules accepted: Orders

## 2015-12-03 NOTE — Patient Instructions (Addendum)
Good to see you today, call us with questions Bring me copy of your living will. Urine check today for protein. Return as needed or in 1 year for next physical. Return for lab visit in 6 months to recheck A1c. We will call you to set up diabetes education.   Health Maintenance, Male A healthy lifestyle and preventative care can promote health and wellness.  Maintain regular health, dental, and eye exams.  Eat a healthy diet. Foods like vegetables, fruits, whole grains, low-fat dairy products, and lean protein foods contain the nutrients you need and are low in calories. Decrease your intake of foods high in solid fats, added sugars, and salt. Get information about a proper diet from your health care provider, if necessary.  Regular physical exercise is one of the most important things you can do for your health. Most adults should get at least 150 minutes of moderate-intensity exercise (any activity that increases your heart rate and causes you to sweat) each week. In addition, most adults need muscle-strengthening exercises on 2 or more days a week.   Maintain a healthy weight. The body mass index (BMI) is a screening tool to identify possible weight problems. It provides an estimate of body fat based on height and weight. Your health care provider can find your BMI and can help you achieve or maintain a healthy weight. For males 20 years and older:  A BMI below 18.5 is considered underweight.  A BMI of 18.5 to 24.9 is normal.  A BMI of 25 to 29.9 is considered overweight.  A BMI of 30 and above is considered obese.  Maintain normal blood lipids and cholesterol by exercising and minimizing your intake of saturated fat. Eat a balanced diet with plenty of fruits and vegetables. Blood tests for lipids and cholesterol should begin at age 33 and be repeated every 5 years. If your lipid or cholesterol levels are high, you are over age 11, or you are at high risk for heart disease, you may need  your cholesterol levels checked more frequently.Ongoing high lipid and cholesterol levels should be treated with medicines if diet and exercise are not working.  If you smoke, find out from your health care provider how to quit. If you do not use tobacco, do not start.  Lung cancer screening is recommended for adults aged 52-80 years who are at high risk for developing lung cancer because of a history of smoking. A yearly low-dose CT scan of the lungs is recommended for people who have at least a 30-pack-year history of smoking and are current smokers or have quit within the past 15 years. A pack year of smoking is smoking an average of 1 pack of cigarettes a day for 1 year (for example, a 30-pack-year history of smoking could mean smoking 1 pack a day for 30 years or 2 packs a day for 15 years). Yearly screening should continue until the smoker has stopped smoking for at least 15 years. Yearly screening should be stopped for people who develop a health problem that would prevent them from having lung cancer treatment.  If you choose to drink alcohol, do not have more than 2 drinks per day. One drink is considered to be 12 oz (360 mL) of beer, 5 oz (150 mL) of wine, or 1.5 oz (45 mL) of liquor.  Avoid the use of street drugs. Do not share needles with anyone. Ask for help if you need support or instructions about stopping the use of drugs.  High blood pressure causes heart disease and increases the risk of stroke. High blood pressure is more likely to develop in:  People who have blood pressure in the end of the normal range (100-139/85-89 mm Hg).  People who are overweight or obese.  People who are African American.  If you are 109-2 years of age, have your blood pressure checked every 3-5 years. If you are 94 years of age or older, have your blood pressure checked every year. You should have your blood pressure measured twice--once when you are at a hospital or clinic, and once when you are not  at a hospital or clinic. Record the average of the two measurements. To check your blood pressure when you are not at a hospital or clinic, you can use:  An automated blood pressure machine at a pharmacy.  A home blood pressure monitor.  If you are 40-82 years old, ask your health care provider if you should take aspirin to prevent heart disease.  Diabetes screening involves taking a blood sample to check your fasting blood sugar level. This should be done once every 3 years after age 32 if you are at a normal weight and without risk factors for diabetes. Testing should be considered at a younger age or be carried out more frequently if you are overweight and have at least 1 risk factor for diabetes.  Colorectal cancer can be detected and often prevented. Most routine colorectal cancer screening begins at the age of 67 and continues through age 78. However, your health care provider may recommend screening at an earlier age if you have risk factors for colon cancer. On a yearly basis, your health care provider may provide home test kits to check for hidden blood in the stool. A small camera at the end of a tube may be used to directly examine the colon (sigmoidoscopy or colonoscopy) to detect the earliest forms of colorectal cancer. Talk to your health care provider about this at age 24 when routine screening begins. A direct exam of the colon should be repeated every 5-10 years through age 30, unless early forms of precancerous polyps or small growths are found.  People who are at an increased risk for hepatitis B should be screened for this virus. You are considered at high risk for hepatitis B if:  You were born in a country where hepatitis B occurs often. Talk with your health care provider about which countries are considered high risk.  Your parents were born in a high-risk country and you have not received a shot to protect against hepatitis B (hepatitis B vaccine).  You have HIV or  AIDS.  You use needles to inject street drugs.  You live with, or have sex with, someone who has hepatitis B.  You are a man who has sex with other men (MSM).  You get hemodialysis treatment.  You take certain medicines for conditions like cancer, organ transplantation, and autoimmune conditions.  Hepatitis C blood testing is recommended for all people born from [birth year] through 1965 and any individual with known risk factors for hepatitis C.  Healthy men should no longer receive prostate-specific antigen (PSA) blood tests as part of routine cancer screening. Talk to your health care provider about prostate cancer screening.  Testicular cancer screening is not recommended for adolescents or adult males who have no symptoms. Screening includes self-exam, a health care provider exam, and other screening tests. Consult with your health care provider about any symptoms you have or any  concerns you have about testicular cancer.  Practice safe sex. Use condoms and avoid high-risk sexual practices to reduce the spread of sexually transmitted infections (STIs).  You should be screened for STIs, including gonorrhea and chlamydia if:  You are sexually active and are younger than 24 years.  You are older than 24 years, and your health care provider tells you that you are at risk for this type of infection.  Your sexual activity has changed since you were last screened, and you are at an increased risk for chlamydia or gonorrhea. Ask your health care provider if you are at risk.  If you are at risk of being infected with HIV, it is recommended that you take a prescription medicine daily to prevent HIV infection. This is called pre-exposure prophylaxis (PrEP). You are considered at risk if:  You are a man who has sex with other men (MSM).  You are a heterosexual man who is sexually active with multiple partners.  You take drugs by injection.  You are sexually active with a partner who has  HIV.  Talk with your health care provider about whether you are at high risk of being infected with HIV. If you choose to begin PrEP, you should first be tested for HIV. You should then be tested every 3 months for as long as you are taking PrEP.  Use sunscreen. Apply sunscreen liberally and repeatedly throughout the day. You should seek shade when your shadow is shorter than you. Protect yourself by wearing long sleeves, pants, a wide-brimmed hat, and sunglasses year round whenever you are outdoors.  Tell your health care provider of new moles or changes in moles, especially if there is a change in shape or color. Also, tell your health care provider if a mole is larger than the size of a pencil eraser.  A one-time screening for abdominal aortic aneurysm (AAA) and surgical repair of large AAAs by ultrasound is recommended for men aged 15-75 years who are current or former smokers.  Stay current with your vaccines (immunizations).   This information is not intended to replace advice given to you by your health care provider. Make sure you discuss any questions you have with your health care provider.   Document Released: 04/24/2008 Document Revised: 11/17/2014 Document Reviewed: 03/24/2011 Elsevier Interactive Patient Education [employer].

## 2015-12-03 NOTE — Assessment & Plan Note (Signed)
Advanced directive - has at home. Will bring me copy. Wife is HCPOA.  

## 2015-12-03 NOTE — Assessment & Plan Note (Signed)
Preventative protocols reviewed and updated unless pt declined. Discussed healthy diet and lifestyle.  

## 2015-12-03 NOTE — Assessment & Plan Note (Signed)
Foot exam today. Upcoming eye exam. Will refer to diabetes education.

## 2015-12-03 NOTE — Assessment & Plan Note (Signed)
Chronic, stable. Continue current regimen of simvastatin and krill.

## 2015-12-06 LAB — HM DIABETES EYE EXAM

## 2015-12-11 ENCOUNTER — Encounter: Payer: Self-pay | Admitting: Family Medicine

## 2016-06-02 ENCOUNTER — Other Ambulatory Visit (INDEPENDENT_AMBULATORY_CARE_PROVIDER_SITE_OTHER): Payer: 59

## 2016-06-02 DIAGNOSIS — E119 Type 2 diabetes mellitus without complications: Secondary | ICD-10-CM

## 2016-06-02 LAB — HEMOGLOBIN A1C: Hgb A1c MFr Bld: 6.6 % — ABNORMAL HIGH (ref 4.6–6.5)

## 2016-07-07 ENCOUNTER — Ambulatory Visit (INDEPENDENT_AMBULATORY_CARE_PROVIDER_SITE_OTHER): Payer: 59

## 2016-07-07 DIAGNOSIS — Z23 Encounter for immunization: Secondary | ICD-10-CM | POA: Diagnosis not present

## 2016-11-20 LAB — HM DIABETES EYE EXAM

## 2016-11-30 ENCOUNTER — Other Ambulatory Visit: Payer: Self-pay | Admitting: Family Medicine

## 2016-11-30 DIAGNOSIS — E785 Hyperlipidemia, unspecified: Secondary | ICD-10-CM

## 2016-11-30 DIAGNOSIS — E119 Type 2 diabetes mellitus without complications: Secondary | ICD-10-CM

## 2016-11-30 DIAGNOSIS — Z125 Encounter for screening for malignant neoplasm of prostate: Secondary | ICD-10-CM

## 2016-12-01 ENCOUNTER — Other Ambulatory Visit (INDEPENDENT_AMBULATORY_CARE_PROVIDER_SITE_OTHER): Payer: 59

## 2016-12-01 DIAGNOSIS — Z125 Encounter for screening for malignant neoplasm of prostate: Secondary | ICD-10-CM

## 2016-12-01 DIAGNOSIS — E119 Type 2 diabetes mellitus without complications: Secondary | ICD-10-CM | POA: Diagnosis not present

## 2016-12-01 DIAGNOSIS — E785 Hyperlipidemia, unspecified: Secondary | ICD-10-CM

## 2016-12-01 LAB — LIPID PANEL
CHOLESTEROL: 138 mg/dL (ref 0–200)
HDL: 39.6 mg/dL (ref >39.00)
LDL Cholesterol: 84 mg/dL (ref 0–99)
NONHDL: 98.61
Total CHOL/HDL Ratio: 3
Triglycerides: 74 mg/dL (ref 0.0–149.0)
VLDL: 14.8 mg/dL (ref 0.0–40.0)

## 2016-12-01 LAB — COMPREHENSIVE METABOLIC PANEL
ALBUMIN: 4 g/dL (ref 3.5–5.2)
ALK PHOS: 65 U/L (ref 39–117)
ALT: 14 U/L (ref 0–53)
AST: 17 U/L (ref 0–37)
BUN: 13 mg/dL (ref 6–23)
CO2: 25 mEq/L (ref 19–32)
CREATININE: 0.99 mg/dL (ref 0.40–1.50)
Calcium: 8.8 mg/dL (ref 8.4–10.5)
Chloride: 106 mEq/L (ref 96–112)
GFR: 96.28 mL/min (ref >60.00)
GLUCOSE: 123 mg/dL — AB (ref 70–99)
Potassium: 4 mEq/L (ref 3.5–5.1)
SODIUM: 139 meq/L (ref 135–145)
Total Bilirubin: 0.6 mg/dL (ref 0.2–1.2)
Total Protein: 7.1 g/dL (ref 6.0–8.3)

## 2016-12-01 LAB — PSA, MEDICARE: PSA: 1.63 ng/mL (ref 0.10–4.00)

## 2016-12-01 LAB — HEMOGLOBIN A1C: HEMOGLOBIN A1C: 6.7 % — AB (ref 4.6–6.5)

## 2016-12-03 NOTE — Addendum Note (Signed)
Addended by: Marchia Bond on: 12/03/2016 04:08 PM   Modules accepted: Orders

## 2016-12-04 LAB — MICROALBUMIN / CREATININE URINE RATIO
Creatinine,U: 217.5 mg/dL
Microalb Creat Ratio: 1.1 mg/g (ref 0.0–30.0)
Microalb, Ur: 2.3 mg/dL — ABNORMAL HIGH (ref 0.0–1.9)

## 2016-12-08 ENCOUNTER — Ambulatory Visit (INDEPENDENT_AMBULATORY_CARE_PROVIDER_SITE_OTHER): Payer: 59 | Admitting: Family Medicine

## 2016-12-08 ENCOUNTER — Encounter: Payer: Self-pay | Admitting: Family Medicine

## 2016-12-08 VITALS — BP 134/82 | HR 68 | Temp 98.3°F | Ht 70.5 in | Wt 195.5 lb

## 2016-12-08 DIAGNOSIS — Z23 Encounter for immunization: Secondary | ICD-10-CM

## 2016-12-08 DIAGNOSIS — Z87891 Personal history of nicotine dependence: Secondary | ICD-10-CM | POA: Diagnosis not present

## 2016-12-08 DIAGNOSIS — M19041 Primary osteoarthritis, right hand: Secondary | ICD-10-CM

## 2016-12-08 DIAGNOSIS — Z86010 Personal history of colonic polyps: Secondary | ICD-10-CM

## 2016-12-08 DIAGNOSIS — E785 Hyperlipidemia, unspecified: Secondary | ICD-10-CM | POA: Diagnosis not present

## 2016-12-08 DIAGNOSIS — Z0000 Encounter for general adult medical examination without abnormal findings: Secondary | ICD-10-CM

## 2016-12-08 DIAGNOSIS — Z7189 Other specified counseling: Secondary | ICD-10-CM

## 2016-12-08 DIAGNOSIS — E119 Type 2 diabetes mellitus without complications: Secondary | ICD-10-CM

## 2016-12-08 MED ORDER — SIMVASTATIN 20 MG PO TABS: 20.0000 mg | ORAL_TABLET | Freq: Every day | ORAL | 3 refills | Status: DC

## 2016-12-08 MED ORDER — SILDENAFIL CITRATE 100 MG PO TABS: 50.0000 mg | ORAL_TABLET | ORAL | 3 refills | Status: DC | PRN

## 2016-12-08 NOTE — Progress Notes (Signed)
Pre visit review using our clinic review tool, if applicable. No additional management support is needed unless otherwise documented below in the visit note. 

## 2016-12-08 NOTE — Assessment & Plan Note (Signed)
Chronic, stable. Continue simvastatin and flax seed oil.

## 2016-12-08 NOTE — Assessment & Plan Note (Signed)
Chronic, stable. Diet controlled.  Foot exam today, eye exam UTD

## 2016-12-08 NOTE — Assessment & Plan Note (Signed)
Anticipate R handed osteoarthritis. Discussed supplements for symptom relief.

## 2016-12-08 NOTE — Assessment & Plan Note (Signed)
Rpt colonoscopy will be due 04/2018

## 2016-12-08 NOTE — Patient Instructions (Addendum)
Tdap today (tetanus and whooping cough).  We will refer you for lung cancer screening CT You are doing well today. Continue healthy diet and lifestyle to keep control of blood sugars.  Return as needed or in 1 year for next physical.  Try lotrimin between toes  Health Maintenance, Male A healthy lifestyle and preventative care can promote health and wellness.  Maintain regular health, dental, and eye exams.  Eat a healthy diet. Foods like vegetables, fruits, whole grains, low-fat dairy products, and lean protein foods contain the nutrients you need and are low in calories. Decrease your intake of foods high in solid fats, added sugars, and salt. Get information about a proper diet from your health care provider, if necessary.  Regular physical exercise is one of the most important things you can do for your health. Most adults should get at least 150 minutes of moderate-intensity exercise (any activity that increases your heart rate and causes you to sweat) each week. In addition, most adults need muscle-strengthening exercises on 2 or more days a week.   Maintain a healthy weight. The body mass index (BMI) is a screening tool to identify possible weight problems. It provides an estimate of body fat based on height and weight. Your health care provider can find your BMI and can help you achieve or maintain a healthy weight. For males 20 years and older:  A BMI below 18.5 is considered underweight.  A BMI of 18.5 to 24.9 is normal.  A BMI of 25 to 29.9 is considered overweight.  A BMI of 30 and above is considered obese.  Maintain normal blood lipids and cholesterol by exercising and minimizing your intake of saturated fat. Eat a balanced diet with plenty of fruits and vegetables. Blood tests for lipids and cholesterol should begin at age 46 and be repeated every 5 years. If your lipid or cholesterol levels are high, you are over age 77, or you are at high risk for heart disease, you may  need your cholesterol levels checked more frequently.Ongoing high lipid and cholesterol levels should be treated with medicines if diet and exercise are not working.  If you smoke, find out from your health care provider how to quit. If you do not use tobacco, do not start.  Lung cancer screening is recommended for adults aged 76-80 years who are at high risk for developing lung cancer because of a history of smoking. A yearly low-dose CT scan of the lungs is recommended for people who have at least a 30-pack-year history of smoking and are current smokers or have quit within the past 15 years. A pack year of smoking is smoking an average of 1 pack of cigarettes a day for 1 year (for example, a 30-pack-year history of smoking could mean smoking 1 pack a day for 30 years or 2 packs a day for 15 years). Yearly screening should continue until the smoker has stopped smoking for at least 15 years. Yearly screening should be stopped for people who develop a health problem that would prevent them from having lung cancer treatment.  If you choose to drink alcohol, do not have more than 2 drinks per day. One drink is considered to be 12 oz (360 mL) of beer, 5 oz (150 mL) of wine, or 1.5 oz (45 mL) of liquor.  Avoid the use of street drugs. Do not share needles with anyone. Ask for help if you need support or instructions about stopping the use of drugs.  High blood  pressure causes heart disease and increases the risk of stroke. High blood pressure is more likely to develop in:  People who have blood pressure in the end of the normal range (100-139/85-89 mm Hg).  People who are overweight or obese.  People who are African American.  If you are 70-33 years of age, have your blood pressure checked every 3-5 years. If you are 13 years of age or older, have your blood pressure checked every year. You should have your blood pressure measured twice-once when you are at a hospital or clinic, and once when you are  not at a hospital or clinic. Record the average of the two measurements. To check your blood pressure when you are not at a hospital or clinic, you can use:  An automated blood pressure machine at a pharmacy.  A home blood pressure monitor.  If you are 68-58 years old, ask your health care provider if you should take aspirin to prevent heart disease.  Diabetes screening involves taking a blood sample to check your fasting blood sugar level. This should be done once every 3 years after age 64 if you are at a normal weight and without risk factors for diabetes. Testing should be considered at a younger age or be carried out more frequently if you are overweight and have at least 1 risk factor for diabetes.  Colorectal cancer can be detected and often prevented. Most routine colorectal cancer screening begins at the age of 75 and continues through age 68. However, your health care provider may recommend screening at an earlier age if you have risk factors for colon cancer. On a yearly basis, your health care provider may provide home test kits to check for hidden blood in the stool. A small camera at the end of a tube may be used to directly examine the colon (sigmoidoscopy or colonoscopy) to detect the earliest forms of colorectal cancer. Talk to your health care provider about this at age 49 when routine screening begins. A direct exam of the colon should be repeated every 5-10 years through age 40, unless early forms of precancerous polyps or small growths are found.  People who are at an increased risk for hepatitis B should be screened for this virus. You are considered at high risk for hepatitis B if:  You were born in a country where hepatitis B occurs often. Talk with your health care provider about which countries are considered high risk.  Your parents were born in a high-risk country and you have not received a shot to protect against hepatitis B (hepatitis B vaccine).  You have HIV or  AIDS.  You use needles to inject street drugs.  You live with, or have sex with, someone who has hepatitis B.  You are a man who has sex with other men (MSM).  You get hemodialysis treatment.  You take certain medicines for conditions like cancer, organ transplantation, and autoimmune conditions.  Hepatitis C blood testing is recommended for all people born from [birth year] through 1965 and any individual with known risk factors for hepatitis C.  Healthy men should no longer receive prostate-specific antigen (PSA) blood tests as part of routine cancer screening. Talk to your health care provider about prostate cancer screening.  Testicular cancer screening is not recommended for adolescents or adult males who have no symptoms. Screening includes self-exam, a health care provider exam, and other screening tests. Consult with your health care provider about any symptoms you have or any concerns you  have about testicular cancer.  Practice safe sex. Use condoms and avoid high-risk sexual practices to reduce the spread of sexually transmitted infections (STIs).  You should be screened for STIs, including gonorrhea and chlamydia if:  You are sexually active and are younger than 24 years.  You are older than 24 years, and your health care provider tells you that you are at risk for this type of infection.  Your sexual activity has changed since you were last screened, and you are at an increased risk for chlamydia or gonorrhea. Ask your health care provider if you are at risk.  If you are at risk of being infected with HIV, it is recommended that you take a prescription medicine daily to prevent HIV infection. This is called pre-exposure prophylaxis (PrEP). You are considered at risk if:  You are a man who has sex with other men (MSM).  You are a heterosexual man who is sexually active with multiple partners.  You take drugs by injection.  You are sexually active with a partner who has  HIV.  Talk with your health care provider about whether you are at high risk of being infected with HIV. If you choose to begin PrEP, you should first be tested for HIV. You should then be tested every 3 months for as long as you are taking PrEP.  Use sunscreen. Apply sunscreen liberally and repeatedly throughout the day. You should seek shade when your shadow is shorter than you. Protect yourself by wearing long sleeves, pants, a wide-brimmed hat, and sunglasses year round whenever you are outdoors.  Tell your health care provider of new moles or changes in moles, especially if there is a change in shape or color. Also, tell your health care provider if a mole is larger than the size of a pencil eraser.  A one-time screening for abdominal aortic aneurysm (AAA) and surgical repair of large AAAs by ultrasound is recommended for men aged 52-75 years who are current or former smokers.  Stay current with your vaccines (immunizations). This information is not intended to replace advice given to you by your health care provider. Make sure you discuss any questions you have with your health care provider. Document Released: 04/24/2008 Document Revised: 11/17/2014 Document Reviewed: 07/31/2015 Elsevier Interactive Patient Education  2017 [employer].

## 2016-12-08 NOTE — Assessment & Plan Note (Signed)
Discussed lung cancer screening CT - pt agrees to referral.

## 2016-12-08 NOTE — Assessment & Plan Note (Addendum)
Advanced directive - brings in today. Delcie Roch (wife) then Thurnell Garbe (friend) are HCPOA.

## 2016-12-08 NOTE — Assessment & Plan Note (Signed)
Preventative protocols reviewed and updated unless pt declined. Discussed healthy diet and lifestyle.  

## 2016-12-08 NOTE — Addendum Note (Signed)
Addended by: Royann Shivers A on: 12/08/2016 11:30 AM   Modules accepted: Orders

## 2016-12-08 NOTE — Progress Notes (Signed)
BP 134/82   Pulse 68   Temp 98.3 F (36.8 C) (Oral)   Ht 5' 10.5" (1.791 m)   Wt 195 lb 8 oz (88.7 kg)   BMI 27.65 kg/m    CC: CPE Subjective:    Patient ID: Edward Lester, male    DOB: [birth year], 71 y.o.   MRN: [id number]  HPI: Edward Lester is a 71 y.o. male presenting on 12/08/2016 for Annual Exam   Some R hand pain in the morning, improves as day progresses. No active synovitis symptoms. No other joints involved. No numbness or weakness of hands. Has started turmeric which has helped as well as anti inflammatory medication.   Walks golf course 3x/wk regularly.  Watches diet - avoids sugars.   Preventative: COLONOSCOPY Date: 2009 1 hyperplastic polyp [facility]). iFOB normal 2017. Wants to rpt colonoscopy 05/2018 Prostate cancer screening - discussed ,would like to screen yearly. Mild BPH on exam Lung cancer screening - quit 2005. 30 PY history - discussed, would like screen Flu shot - yearly Requests updated tetanus (new grandson).  Pneumovax - 11/28/2013, prevnar 05/2015 zostavax 2008 Advanced directive - brings in today. Delcie Roch (wife) then Thurnell Garbe (friend) are HCPOA.  Seat belt use discussed Sunscreen use discussed, no changing moles on skin.  Yearly eye exam  Ex smoker - quit 2005.  Alcohol - social  Lives with wife Grown children Jehova's witness Occupation: Printmaker rep - Education officer, museum Edu: college  Activity: golfing 3x/wk  Diet: good water, fruits/vegetables daily   Relevant past medical, surgical, family and social history reviewed and updated as indicated. Interim medical history since our last visit reviewed. Allergies and medications reviewed and updated. Current Outpatient Prescriptions on File Prior to Visit  Medication Sig  . aspirin (ASPIRIN EC) 81 MG EC tablet Take 81 mg by mouth daily. Reported on 12/03/2015  . Krill Oil Ultra Strength 1500 MG CAPS Take 1,500 mg by mouth daily. MegaRed Omega   No current  facility-administered medications on file prior to visit.     Review of Systems  Constitutional: Negative for activity change, appetite change, chills, fatigue, fever and unexpected weight change.  HENT: Positive for congestion (recovering from recent URI). Negative for hearing loss.   Eyes: Negative for visual disturbance.  Respiratory: Negative for cough, chest tightness, shortness of breath and wheezing.   Cardiovascular: Negative for chest pain, palpitations and leg swelling.  Gastrointestinal: Negative for abdominal distention, abdominal pain, blood in stool, constipation, diarrhea, nausea and vomiting.  Genitourinary: Negative for difficulty urinating and hematuria.  Musculoskeletal: Negative for arthralgias, myalgias and neck pain.  Skin: Negative for rash.  Neurological: Negative for dizziness, seizures, syncope and headaches.  Hematological: Negative for adenopathy. Does not bruise/bleed easily.  Psychiatric/Behavioral: Negative for dysphoric mood. The patient is not nervous/anxious.    Per HPI unless specifically indicated in ROS section     Objective:    BP 134/82   Pulse 68   Temp 98.3 F (36.8 C) (Oral)   Ht 5' 10.5" (1.791 m)   Wt 195 lb 8 oz (88.7 kg)   BMI 27.65 kg/m   Wt Readings from Last 3 Encounters:  12/08/16 195 lb 8 oz (88.7 kg)  12/03/15 191 lb 8 oz (86.9 kg)  05/16/15 186 lb 8 oz (84.6 kg)    Physical Exam  Constitutional: He is oriented to person, place, and time. He appears well-developed and well-nourished. No distress.  HENT:  Head: Normocephalic and atraumatic.  Right Ear: Hearing, tympanic membrane,  external ear and ear canal normal.  Left Ear: Hearing, tympanic membrane, external ear and ear canal normal.  Nose: Nose normal.  Mouth/Throat: Uvula is midline, oropharynx is clear and moist and mucous membranes are normal. No oropharyngeal exudate, posterior oropharyngeal edema or posterior oropharyngeal erythema.  Eyes: Conjunctivae and EOM are  normal. Pupils are equal, round, and reactive to light. No scleral icterus.  Neck: Normal range of motion. Neck supple. Carotid bruit is not present. No thyromegaly present.  Cardiovascular: Normal rate, regular rhythm, normal heart sounds and intact distal pulses.   No murmur heard. Pulses:      Radial pulses are 2+ on the right side, and 2+ on the left side.  Pulmonary/Chest: Effort normal and breath sounds normal. No respiratory distress. He has no wheezes. He has no rales.  Abdominal: Soft. Bowel sounds are normal. He exhibits no distension and no mass. There is no tenderness. There is no rebound and no guarding.  Musculoskeletal: Normal range of motion. He exhibits no edema.  Lymphadenopathy:    He has no cervical adenopathy.  Neurological: He is alert and oriented to person, place, and time.  CN grossly intact, station and gait intact  Skin: Skin is warm and dry. No rash noted.  Psychiatric: He has a normal mood and affect. His behavior is normal. Judgment and thought content normal.  Nursing note and vitals reviewed.  Diabetic Foot Exam - Simple   Simple Foot Form Diabetic Foot exam was performed with the following findings:  Yes 12/08/2016 11:20 AM  Visual Inspection See comments:  Yes Sensation Testing Intact to touch and monofilament testing bilaterally:  Yes Pulse Check Posterior Tibialis and Dorsalis pulse intact bilaterally:  Yes Comments Some skin maceration between toes L foot     Results for orders placed or performed in visit on 12/08/16  HM DIABETES EYE EXAM  Result Value Ref Range   HM Diabetic Eye Exam No Retinopathy No Retinopathy      Assessment & Plan:   Problem List Items Addressed This Visit    Advanced care planning/counseling discussion    Advanced directive - brings in today. Delcie Roch (wife) then Thurnell Garbe (friend) are HCPOA.       Arthritis of right hand    Anticipate R handed osteoarthritis. Discussed supplements for symptom relief.       Diabetes mellitus type 2, diet-controlled (HCC)    Chronic, stable. Diet controlled.  Foot exam today, eye exam UTD      Relevant Medications   simvastatin (ZOCOR) 20 MG tablet   Ex-smoker    Discussed lung cancer screening CT - pt agrees to referral.       Relevant Orders   Ambulatory Referral for [place] care maintenance - Primary    Preventative protocols reviewed and updated unless pt declined. Discussed healthy diet and lifestyle.       History of colonic polyps    Rpt colonoscopy will be due 04/2018      HLD (hyperlipidemia)    Chronic, stable. Continue simvastatin and flax seed oil.       Relevant Medications   sildenafil (VIAGRA) 100 MG tablet   simvastatin (ZOCOR) 20 MG tablet       Follow up plan: Return in about 1 year (around 12/08/2017) for annual exam, prior fasting for blood work.  Ria Bush, MD

## 2016-12-15 ENCOUNTER — Telehealth: Payer: Self-pay | Admitting: *Deleted

## 2016-12-15 DIAGNOSIS — Z87891 Personal history of nicotine dependence: Secondary | ICD-10-CM

## 2016-12-15 NOTE — Telephone Encounter (Signed)
Received referral for initial lung cancer screening scan. Contacted patient and obtained smoking history,(former, quit in 2005, 30 pack year) as well as answering questions related to screening process. Patient denies signs of lung cancer such as weight loss or hemoptysis. Patient denies comorbidity that would prevent curative treatment if lung cancer were found. Patient is tentatively scheduled for shared decision making visit and CT scan on 12/23/16 at 1:30pm, pending insurance approval from business office.

## 2016-12-23 ENCOUNTER — Inpatient Hospital Stay: Payer: 59 | Attending: Oncology | Admitting: Oncology

## 2016-12-23 ENCOUNTER — Ambulatory Visit
Admission: RE | Admit: 2016-12-23 | Discharge: 2016-12-23 | Disposition: A | Discharge status: Home / Self Care | Payer: 59 | Source: Ambulatory Visit | Attending: Oncology | Admitting: Oncology

## 2016-12-23 DIAGNOSIS — Z87891 Personal history of nicotine dependence: Secondary | ICD-10-CM | POA: Diagnosis not present

## 2016-12-23 DIAGNOSIS — I2510 Atherosclerotic heart disease of native coronary artery without angina pectoris: Secondary | ICD-10-CM | POA: Diagnosis not present

## 2016-12-23 DIAGNOSIS — I700 Atherosclerosis of aorta: Secondary | ICD-10-CM | POA: Insufficient documentation

## 2016-12-23 DIAGNOSIS — J439 Emphysema, unspecified: Secondary | ICD-10-CM | POA: Diagnosis not present

## 2016-12-23 DIAGNOSIS — Z122 Encounter for screening for malignant neoplasm of respiratory organs: Secondary | ICD-10-CM

## 2016-12-23 NOTE — Progress Notes (Signed)
In accordance with CMS guidelines, patient has met eligibility criteria including age, absence of signs or symptoms of lung cancer.  Social History  Substance Use Topics  . Smoking status: Former Smoker    Packs/day: 1.00    Years: 30.00    Types: Cigarettes    Start date: 11/11/1967    Quit date: 11/11/2003  . Smokeless tobacco: Never Used  . Alcohol use Yes     Comment: social     A shared decision-making session was conducted prior to the performance of CT scan. This includes one or more decision aids, includes benefits and harms of screening, follow-up diagnostic testing, over-diagnosis, false positive rate, and total radiation exposure.  Counseling on the importance of adherence to annual lung cancer LDCT screening, impact of co-morbidities, and ability or willingness to undergo diagnosis and treatment is imperative for compliance of the program.  Counseling on the importance of continued smoking cessation for former smokers; the importance of smoking cessation for current smokers, and information about tobacco cessation interventions have been given to patient including [place] and [place] quit [place] programs.  Written order for lung cancer screening with LDCT has been given to the patient and any and all questions have been answered to the best of my abilities.   Yearly follow up will be coordinated by Burgess Estelle, Thoracic Navigator.

## 2016-12-26 ENCOUNTER — Encounter: Payer: Self-pay | Admitting: *Deleted

## 2016-12-29 ENCOUNTER — Encounter: Payer: Self-pay | Admitting: Family Medicine

## 2016-12-29 ENCOUNTER — Ambulatory Visit (INDEPENDENT_AMBULATORY_CARE_PROVIDER_SITE_OTHER): Payer: 59 | Admitting: Family Medicine

## 2016-12-29 VITALS — BP 104/70 | HR 65 | Temp 97.7°F | Ht 71.0 in | Wt 194.0 lb

## 2016-12-29 DIAGNOSIS — G5601 Carpal tunnel syndrome, right upper limb: Secondary | ICD-10-CM

## 2016-12-29 DIAGNOSIS — R911 Solitary pulmonary nodule: Secondary | ICD-10-CM | POA: Insufficient documentation

## 2016-12-29 DIAGNOSIS — I2510 Atherosclerotic heart disease of native coronary artery without angina pectoris: Secondary | ICD-10-CM | POA: Diagnosis not present

## 2016-12-29 DIAGNOSIS — J439 Emphysema, unspecified: Secondary | ICD-10-CM | POA: Insufficient documentation

## 2016-12-29 DIAGNOSIS — I2584 Coronary atherosclerosis due to calcified coronary lesion: Secondary | ICD-10-CM | POA: Insufficient documentation

## 2016-12-29 DIAGNOSIS — I700 Atherosclerosis of aorta: Secondary | ICD-10-CM | POA: Diagnosis not present

## 2016-12-29 DIAGNOSIS — J432 Centrilobular emphysema: Secondary | ICD-10-CM | POA: Diagnosis not present

## 2016-12-29 HISTORY — DX: Atherosclerosis of aorta: I70.0

## 2016-12-29 HISTORY — DX: Atherosclerotic heart disease of native coronary artery without angina pectoris: I25.10

## 2016-12-29 HISTORY — DX: Emphysema, unspecified: J43.9

## 2016-12-29 NOTE — Progress Notes (Signed)
BP 104/70   Pulse 65   Temp 97.7 F (36.5 C) (Oral)   Ht 5\' 11"  (1.803 m)   Wt 194 lb (88 kg)   BMI 27.06 kg/m    CC: R hand pain/numbness Subjective:    Patient ID: Edward Lester, male    DOB: 1946/01/06, 71 y.o.   MRN: [id number]  HPI: Edward Lester is a 71 y.o. male presenting on 12/29/2016 for Numbness (right thumb, index and middle digits x3 weeks, especially noted at night, no known injury)   3 wk h/o R hand numbness and paresthesias predominantly noted at 1st -3rd digits. No inciting trauma/injury endorsed. Previously thought hand OA. Turmeric was helpful. Not affecting golf game. It is affecting grip strength. More noticeable at night time - at times wakes him up from sleep. Hasn't tried NSAID.   H/o L hand CTS s/p release ~2013.   Relevant past medical, surgical, family and social history reviewed and updated as indicated. Interim medical history since our last visit reviewed. Allergies and medications reviewed and updated.  Outpatient Medications Prior to Visit  Medication Sig Dispense Refill  . aspirin (ASPIRIN EC) 81 MG EC tablet Take 81 mg by mouth daily. Reported on 12/03/2015    . Krill Oil Ultra Strength 1500 MG CAPS Take 1,500 mg by mouth daily. MegaRed Omega    . sildenafil (VIAGRA) 100 MG tablet Take 0.5-1 tablets (50-100 mg total) by mouth as needed for erectile dysfunction. 30 tablet 3  . simvastatin (ZOCOR) 20 MG tablet Take 1 tablet (20 mg total) by mouth daily at 6 PM. 90 tablet 3   No facility-administered medications prior to visit.      Per HPI unless specifically indicated in ROS section below Review of Systems     Objective:    BP 104/70   Pulse 65   Temp 97.7 F (36.5 C) (Oral)   Ht 5\' 11"  (1.803 m)   Wt 194 lb (88 kg)   BMI 27.06 kg/m   Wt Readings from Last 3 Encounters:  12/29/16 194 lb (88 kg)  12/23/16 191 lb (86.6 kg)  12/08/16 195 lb 8 oz (88.7 kg)    Physical Exam  Constitutional: He is oriented to person, place, and time. He  appears well-developed and well-nourished. No distress.  Musculoskeletal: He exhibits no edema.  2+ rad pulses bilaterally No active synovitis  Neurological: He is alert and oriented to person, place, and time.  Neg tinel Pos phalen Grip strength intact  Skin: Skin is warm and dry. No rash noted.  Psychiatric: He has a normal mood and affect.  Nursing note and vitals reviewed.     Assessment & Plan:   Problem List Items Addressed This Visit    Aortic atherosclerosis ([place])    Reviewed with patient. Recommended optimal chol and blood pressure management.       CAD (coronary artery disease)    Reviewed with patient. Recommended optimal chol and blood pressure management.       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 Discussed etiology and treatment options with patient. rec start with wrist brace at night, NSAID PRN at night. If no better with this, pt will let me know for referral to hand surgery for definitive management. Pt agrees with plan.      Emphysema lung ([place])    Discussed recent CT results with patient - he denies dyspnea, cough, wheezing. Will continue to monitor. Ex smoker.  Follow up plan: Return if symptoms worsen or fail to improve.  Ria Bush, MD

## 2016-12-29 NOTE — Patient Instructions (Addendum)
Lotrimin for toes You do have right sided carpal tunnel syndrome. Treat with wrist brace at night time, may try aleve or advil at night as well to help with any inflammation. If no better with this, send me MyChart message and I will refer you to hand surgery.    Carpal Tunnel Syndrome Carpal tunnel syndrome is a condition that causes pain in your hand and arm. The carpal tunnel is a narrow area located on the palm side of your wrist. Repeated wrist motion or certain diseases may cause swelling within the tunnel. This swelling pinches the main nerve in the wrist (median nerve). What are the causes? This condition may be caused by:  Repeated wrist motions.  Wrist injuries.  Arthritis.  A cyst or tumor in the carpal tunnel.  Fluid buildup during pregnancy. Sometimes the cause of this condition is not known. What increases the risk? This condition is more likely to develop in:  People who have jobs that cause them to repeatedly move their wrists in the same motion, such as Art gallery manager.  Women.  People with certain conditions, such as:  Diabetes.  Obesity.  An underactive thyroid (hypothyroidism).  Kidney failure. What are the signs or symptoms? Symptoms of this condition include:  A tingling feeling in your fingers, especially in your thumb, index, and middle fingers.  Tingling or numbness in your hand.  An aching feeling in your entire arm, especially when your wrist and elbow are bent for long periods of time.  Wrist pain that goes up your arm to your shoulder.  Pain that goes down into your palm or fingers.  A weak feeling in your hands. You may have trouble grabbing and holding items. Your symptoms may feel worse during the night. How is this diagnosed? This condition is diagnosed with a medical history and physical exam. You may also have tests, including:  An electromyogram (EMG). This test measures electrical signals sent by your nerves into the  muscles.  X-rays. How is this treated? Treatment for this condition includes:  Lifestyle changes. It is important to stop doing or modify the activity that caused your condition.  Physical or occupational therapy.  Medicines for pain and inflammation. This may include medicine that is injected into your wrist.  A wrist splint.  Surgery. Follow these instructions at home: If you have a splint:  Wear it as told by your health care provider. Remove it only as told by your health care provider.  Loosen the splint if your fingers become numb and tingle, or if they turn cold and blue.  Keep the splint clean and dry. General instructions  Take over-the-counter and prescription medicines only as told by your health care provider.  Rest your wrist from any activity that may be causing your pain. If your condition is work related, talk to your employer about changes that can be made, such as getting a wrist pad to use while typing.  If directed, apply ice to the painful area:  Put ice in a plastic bag.  Place a towel between your skin and the bag.  Leave the ice on for 20 minutes, 2-3 times per day.  Keep all follow-up visits as told by your health care provider. This is important.  Do any exercises as told by your health care provider, physical therapist, or occupational therapist. Contact a health care provider if:  You have new symptoms.  Your pain is not controlled with medicines.  Your symptoms get worse. This information  is not intended to replace advice given to you by your health care provider. Make sure you discuss any questions you have with your health care provider. Document Released: 10/24/2000 Document Revised: 03/06/2016 Document Reviewed: 03/14/2015 Elsevier Interactive Patient Education  2017 [employer].

## 2016-12-29 NOTE — Assessment & Plan Note (Signed)
Reviewed with patient. Recommended optimal chol and blood pressure management.

## 2016-12-29 NOTE — Assessment & Plan Note (Signed)
Story/exam consistent with R CTS. Discussed etiology and treatment options with patient. rec start with wrist brace at night, NSAID PRN at night. If no better with this, pt will let me know for referral to hand surgery for definitive management. Pt agrees with plan.

## 2016-12-29 NOTE — Progress Notes (Signed)
Pre visit review using our clinic review tool, if applicable. No additional management support is needed unless otherwise documented below in the visit note. 

## 2016-12-29 NOTE — Assessment & Plan Note (Signed)
Discussed recent CT results with patient - he denies dyspnea, cough, wheezing. Will continue to monitor. Ex smoker.

## 2017-01-19 ENCOUNTER — Telehealth: Payer: Self-pay | Admitting: *Deleted

## 2017-01-19 DIAGNOSIS — G5601 Carpal tunnel syndrome, right upper limb: Secondary | ICD-10-CM

## 2017-01-19 NOTE — Telephone Encounter (Signed)
CTS no better per Mychart message. Ready for referral.

## 2017-01-19 NOTE — Telephone Encounter (Signed)
PT sent the following message via MyChart. Please advise.   Appointment Request From: Edward Lester    With Provider: Ria Bush, MD [facility] [place]    Preferred Date Range: From 01/27/2017 To 01/30/2017    Preferred Times: Any    Reason for visit: Office Visit    Comments:  Need to see Hand Specialist -Carpal Tunnel issue, no better. Dr. Darnell Level. suggested that I should get hand brace, I did, no improvement. (still having numbness) I shouldn't have to pay office visit for Dr. Darnell Level. , I now need hand specialist.

## 2017-01-20 NOTE — Addendum Note (Signed)
Addended by: Ria Bush on: 01/20/2017 06:37 AM   Modules accepted: Orders

## 2017-01-20 NOTE — Telephone Encounter (Signed)
Referral placed.

## 2017-04-10 LAB — HM DIABETES EYE EXAM

## 2017-11-30 ENCOUNTER — Encounter: Payer: Self-pay | Admitting: Family Medicine

## 2017-11-30 ENCOUNTER — Ambulatory Visit: Payer: 59 | Admitting: Family Medicine

## 2017-11-30 VITALS — BP 120/68 | HR 60 | Temp 98.0°F | Wt 195.0 lb

## 2017-11-30 DIAGNOSIS — Z23 Encounter for immunization: Secondary | ICD-10-CM

## 2017-11-30 DIAGNOSIS — S4992XA Unspecified injury of left shoulder and upper arm, initial encounter: Secondary | ICD-10-CM | POA: Diagnosis not present

## 2017-11-30 NOTE — Patient Instructions (Signed)
Flu shot today Possible shoulder separation as well as rotator cuff injury. Do exercises provided today.  Continue naprosyn and rest of shoulder. Continue ice or heating pad (whichever soothes better).  Follow up with Dr Lorelei Pont our sports medicine doctor in 2 weeks, sooner if any worsening.

## 2017-11-30 NOTE — Progress Notes (Addendum)
BP 120/68 (BP Location: Left Arm, Patient Position: Sitting, Cuff Size: Normal)   Pulse 60   Temp 98 F (36.7 C) (Oral)   Wt 195 lb (88.5 kg)   SpO2 96%   BMI 27.20 kg/m    CC: shoulder injury Subjective:    Patient ID: Edward Lester, male    DOB: 1946-04-19, 72 y.o.   MRN: [id number]  HPI: Edward Lester is a 72 y.o. male presenting on 11/30/2017 for Shoulder Injury (Pt fell 11/15/17 on the golf course landing on left side. Seen at [facility], had x-ray and given naprosyn and hydrocodone.  Still taking naprosyn BID and hydrocodone PRN. Provided avs of UC visit and x-ray.)   Here with family member Delcie Roch.   DOI: 11/15/2017 While playing golf in [place], chipped into wet mound, slipped and landed on L arm and shoulder on hole 1. Some ache so stopped playing but continued with them all 18 holes. Unable to drive ball at that time. That night worse pain, with some limited ROM. Treated with ice and heat.   Seen at [facility] 11/20/2017 and brings discharge summary - xray with concern for L clavicle separation vs fracture. Treated with percocets and naprosyn.   Since then, improving pain and shoulder ROM. Persistent pain with sleeping on left shoulder.   Relevant past medical, surgical, family and social history reviewed and updated as indicated. Interim medical history since our last visit reviewed. Allergies and medications reviewed and updated. Outpatient Medications Prior to Visit  Medication Sig Dispense Refill  . aspirin (ASPIRIN EC) 81 MG EC tablet Take 81 mg by mouth daily. Reported on 12/03/2015    . Krill Oil Ultra Strength 1500 MG CAPS Take 1,500 mg by mouth daily. MegaRed Omega    . sildenafil (VIAGRA) 100 MG tablet Take 0.5-1 tablets (50-100 mg total) by mouth as needed for erectile dysfunction. 30 tablet 3  . simvastatin (ZOCOR) 20 MG tablet Take 1 tablet (20 mg total) by mouth daily at 6 PM. 90 tablet 3   No facility-administered medications prior to visit.      Per HPI  unless specifically indicated in ROS section below Review of Systems     Objective:    BP 120/68 (BP Location: Left Arm, Patient Position: Sitting, Cuff Size: Normal)   Pulse 60   Temp 98 F (36.7 C) (Oral)   Wt 195 lb (88.5 kg)   SpO2 96%   BMI 27.20 kg/m   Wt Readings from Last 3 Encounters:  11/30/17 195 lb (88.5 kg)  12/29/16 194 lb (88 kg)  12/23/16 191 lb (86.6 kg)    Physical Exam  Constitutional: He is oriented to person, place, and time. He appears well-developed and well-nourished. No distress.  Musculoskeletal: He exhibits no edema.  R shoulder WNL L shoulder exam: No deformity of shoulders on inspection. Tender at [facility] joint FROM in abduction and forward flexion. Tender with testing SITS in ext/int rotation. + pain with empty can sign. + pain with Speed test. No impingement. ++ pain with crossover test. No pain with rotation of humeral head in [place] joint.   Neurological: He is alert and oriented to person, place, and time.  Skin: Skin is warm and dry. No rash noted.  Psychiatric: He has a normal mood and affect.  Nursing note and vitals reviewed.     Assessment & Plan:   Problem List Items Addressed This Visit    Injury of left shoulder - Primary    I  was unable to open xray he brings on CD. We are awaiting final radiology read of xrays. Anticipate he had shoulder separation injury after fall 2 wks ago, seems to be healing remarkably well. Anticipate low grade sprain. Further supportive care reviewed. Stretching/strengthening exercises provided from [facility] pt advisor on shoulder separation. F/u with Dr Lorelei Pont in 2 wks. Pt agrees with plan.        Other Visit Diagnoses    Need for influenza vaccination       Relevant Orders   Flu Vaccine QUAD 6+ mos PF IM (Fluarix Quad PF) (Completed)       Follow up plan: Return if symptoms worsen or fail to improve.  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 L wrist with mild DJD with no fracture or acute bone abnormality.

## 2017-11-30 NOTE — Assessment & Plan Note (Signed)
I was unable to open xray he brings on CD. We are awaiting final radiology read of xrays. Anticipate he had shoulder separation injury after fall 2 wks ago, seems to be healing remarkably well. Anticipate low grade sprain. Further supportive care reviewed. Stretching/strengthening exercises provided from [facility] pt advisor on shoulder separation. F/u with Dr Lorelei Pont in 2 wks. Pt agrees with plan.

## 2017-12-06 ENCOUNTER — Other Ambulatory Visit: Payer: Self-pay | Admitting: Family Medicine

## 2017-12-06 DIAGNOSIS — E785 Hyperlipidemia, unspecified: Secondary | ICD-10-CM

## 2017-12-06 DIAGNOSIS — E119 Type 2 diabetes mellitus without complications: Secondary | ICD-10-CM

## 2017-12-07 ENCOUNTER — Other Ambulatory Visit (INDEPENDENT_AMBULATORY_CARE_PROVIDER_SITE_OTHER): Payer: 59

## 2017-12-07 DIAGNOSIS — E785 Hyperlipidemia, unspecified: Secondary | ICD-10-CM

## 2017-12-07 DIAGNOSIS — E119 Type 2 diabetes mellitus without complications: Secondary | ICD-10-CM | POA: Diagnosis not present

## 2017-12-07 LAB — COMPREHENSIVE METABOLIC PANEL
ALBUMIN: 3.9 g/dL (ref 3.5–5.2)
ALT: 11 U/L (ref 0–53)
AST: 13 U/L (ref 0–37)
Alkaline Phosphatase: 59 U/L (ref 39–117)
BUN: 19 mg/dL (ref 6–23)
CHLORIDE: 109 meq/L (ref 96–112)
CO2: 25 meq/L (ref 19–32)
CREATININE: 1.09 mg/dL (ref 0.40–1.50)
Calcium: 8.8 mg/dL (ref 8.4–10.5)
GFR: 85.91 mL/min (ref >60.00)
GLUCOSE: 128 mg/dL — AB (ref 70–99)
Potassium: 4.3 mEq/L (ref 3.5–5.1)
SODIUM: 142 meq/L (ref 135–145)
Total Bilirubin: 0.4 mg/dL (ref 0.2–1.2)
Total Protein: 6.8 g/dL (ref 6.0–8.3)

## 2017-12-07 LAB — LIPID PANEL
CHOL/HDL RATIO: 4
Cholesterol: 148 mg/dL (ref 0–200)
HDL: 39.1 mg/dL (ref >39.00)
LDL Cholesterol: 95 mg/dL (ref 0–99)
NONHDL: 108.98
Triglycerides: 72 mg/dL (ref 0.0–149.0)
VLDL: 14.4 mg/dL (ref 0.0–40.0)

## 2017-12-07 LAB — MICROALBUMIN / CREATININE URINE RATIO
Creatinine,U: 189.5 mg/dL
MICROALB UR: 1.4 mg/dL (ref 0.0–1.9)
Microalb Creat Ratio: 0.7 mg/g (ref 0.0–30.0)

## 2017-12-07 LAB — PSA: PSA: 0.88 ng/mL (ref 0.10–4.00)

## 2017-12-07 LAB — HEMOGLOBIN A1C: HEMOGLOBIN A1C: 6.7 % — AB (ref 4.6–6.5)

## 2017-12-07 NOTE — Addendum Note (Signed)
Addended by: Ellamae Sia on: 12/07/2017 10:49 AM   Modules accepted: Orders

## 2017-12-09 ENCOUNTER — Encounter: Payer: Self-pay | Admitting: Family Medicine

## 2017-12-09 ENCOUNTER — Ambulatory Visit (INDEPENDENT_AMBULATORY_CARE_PROVIDER_SITE_OTHER): Payer: 59 | Admitting: Family Medicine

## 2017-12-09 VITALS — BP 124/70 | HR 63 | Temp 98.0°F | Ht 69.75 in | Wt 193.0 lb

## 2017-12-09 DIAGNOSIS — I2510 Atherosclerotic heart disease of native coronary artery without angina pectoris: Secondary | ICD-10-CM | POA: Diagnosis not present

## 2017-12-09 DIAGNOSIS — E119 Type 2 diabetes mellitus without complications: Secondary | ICD-10-CM | POA: Diagnosis not present

## 2017-12-09 DIAGNOSIS — B353 Tinea pedis: Secondary | ICD-10-CM

## 2017-12-09 DIAGNOSIS — J432 Centrilobular emphysema: Secondary | ICD-10-CM | POA: Diagnosis not present

## 2017-12-09 DIAGNOSIS — E1136 Type 2 diabetes mellitus with diabetic cataract: Secondary | ICD-10-CM

## 2017-12-09 DIAGNOSIS — Z7189 Other specified counseling: Secondary | ICD-10-CM | POA: Diagnosis not present

## 2017-12-09 DIAGNOSIS — I700 Atherosclerosis of aorta: Secondary | ICD-10-CM

## 2017-12-09 DIAGNOSIS — E785 Hyperlipidemia, unspecified: Secondary | ICD-10-CM | POA: Diagnosis not present

## 2017-12-09 DIAGNOSIS — Z0000 Encounter for general adult medical examination without abnormal findings: Secondary | ICD-10-CM | POA: Diagnosis not present

## 2017-12-09 DIAGNOSIS — Z87891 Personal history of nicotine dependence: Secondary | ICD-10-CM | POA: Diagnosis not present

## 2017-12-09 MED ORDER — NAPROXEN 500 MG PO TABS: 500.0000 mg | ORAL_TABLET | Freq: Two times a day (BID) | ORAL | 0 refills | Status: DC

## 2017-12-09 NOTE — Progress Notes (Signed)
BP 124/70 (BP Location: Left Arm, Patient Position: Sitting, Cuff Size: Normal)   Pulse 63   Temp 98 F (36.7 C) (Oral)   Ht 5' 9.75" (1.772 m)   Wt 193 lb (87.5 kg)   SpO2 96%   BMI 27.89 kg/m    CC: CPE Subjective:    Patient ID: Edward Lester, male    DOB: [birth year], 72 y.o.   MRN: [id number]  HPI: Yukio Bisping is a 72 y.o. male presenting on 12/09/2017 for Annual Exam   Did not see Katha Cabal this year. Does not have medicare.  See prior note for details - presumed shoulder separation injury after fall. Planned f/u with SM next week. Continues healing well.  Diet controlled diabetes - occasional paresthesias.   Preventative: COLONOSCOPY Date: 2009 1 hyperplastic polyp [facility]). iFOB normal 2017. Wants to rpt colonoscopy 05/2018.  Prostate cancer screening - discussed ,would like to screen yearly. Mild BPH on exam Lung cancer screening - quit 2005. 30 PY history - discussed, would like screen Flu shot - yearly Requests updated tetanus (new grandson).  Pneumovax - 11/28/2013, prevnar 05/2015 zostavax 2008 shingrix - discussed Advanced directive - scanned 11/2016 Delcie Roch (wife) then Thurnell Garbe (friend) are HCPOA. Jehova's witness. Ok with CPR and temporary life support but not prolonged if terminal condition. Ok with feeding tube.  Seat belt use discussed Sunscreen use discussed, no changing moles on skin.  Yearly eye exam  Ex smoker - quit 2005.  Alcohol - social  Lives with wife Grown children Jehova's witness Occupation: Printmaker rep - Education officer, museum Edu: college  Activity: golfing 3x/wk  Diet: good water, fruits/vegetables daily   Relevant past medical, surgical, family and social history reviewed and updated as indicated. Interim medical history since our last visit reviewed. Allergies and medications reviewed and updated. Outpatient Medications Prior to Visit  Medication Sig Dispense Refill  . aspirin (ASPIRIN EC) 81 MG EC tablet Take 81 mg  by mouth daily. Reported on 12/03/2015    . simvastatin (ZOCOR) 20 MG tablet Take 1 tablet (20 mg total) by mouth daily at 6 PM. 90 tablet 3  . naproxen (NAPROSYN) 500 MG tablet Take 500 mg by mouth 2 (two) times daily with a meal.  0  . Krill Oil Ultra Strength 1500 MG CAPS Take 1,500 mg by mouth daily. MegaRed Omega    . sildenafil (VIAGRA) 100 MG tablet Take 0.5-1 tablets (50-100 mg total) by mouth as needed for erectile dysfunction. 30 tablet 3   No facility-administered medications prior to visit.      Per HPI unless specifically indicated in ROS section below Review of Systems  Constitutional: Negative for activity change, appetite change, chills, fatigue, fever and unexpected weight change.  HENT: Negative for hearing loss.   Eyes: Negative for visual disturbance.  Respiratory: Negative for cough, chest tightness, shortness of breath and wheezing.   Cardiovascular: Negative for chest pain, palpitations and leg swelling.  Gastrointestinal: Negative for abdominal distention, abdominal pain, blood in stool, constipation, diarrhea, nausea and vomiting.  Genitourinary: Negative for difficulty urinating and hematuria.  Musculoskeletal: Negative for arthralgias, myalgias and neck pain.  Skin: Negative for rash.  Neurological: Negative for dizziness, seizures, syncope and headaches.  Hematological: Negative for adenopathy. Does not bruise/bleed easily.  Psychiatric/Behavioral: Negative for dysphoric mood. The patient is not nervous/anxious.        Objective:    BP 124/70 (BP Location: Left Arm, Patient Position: Sitting, Cuff Size: Normal)   Pulse 63  Temp 98 F (36.7 C) (Oral)   Ht 5' 9.75" (1.772 m)   Wt 193 lb (87.5 kg)   SpO2 96%   BMI 27.89 kg/m   Wt Readings from Last 3 Encounters:  12/09/17 193 lb (87.5 kg)  11/30/17 195 lb (88.5 kg)  12/29/16 194 lb (88 kg)    Physical Exam  Constitutional: He is oriented to person, place, and time. He appears well-developed and  well-nourished. No distress.  HENT:  Head: Normocephalic and atraumatic.  Right Ear: Hearing, tympanic membrane, external ear and ear canal normal.  Left Ear: Hearing, tympanic membrane, external ear and ear canal normal.  Nose: Nose normal.  Mouth/Throat: Uvula is midline, oropharynx is clear and moist and mucous membranes are normal. No oropharyngeal exudate, posterior oropharyngeal edema or posterior oropharyngeal erythema.  Eyes: Conjunctivae and EOM are normal. Pupils are equal, round, and reactive to light. No scleral icterus.  Neck: Normal range of motion. Neck supple. Carotid bruit is not present. No thyromegaly present.  Cardiovascular: Normal rate, regular rhythm, normal heart sounds and intact distal pulses.  No murmur heard. Pulses:      Radial pulses are 2+ on the right side, and 2+ on the left side.  Pulmonary/Chest: Effort normal and breath sounds normal. No respiratory distress. He has no wheezes. He has no rales.  Abdominal: Soft. Bowel sounds are normal. He exhibits no distension and no mass. There is no tenderness. There is no rebound and no guarding.  Genitourinary: Rectum normal and prostate normal. Rectal exam shows no external hemorrhoid, no internal hemorrhoid, no fissure, no mass, no tenderness and anal tone normal. Prostate is not enlarged (20gm) and not tender.  Musculoskeletal: Normal range of motion. He exhibits no edema.  See HPI for foot exam if done  Lymphadenopathy:    He has no cervical adenopathy.  Neurological: He is alert and oriented to person, place, and time.  CN grossly intact, station and gait intact  Skin: Skin is warm and dry. No rash noted.  Psychiatric: He has a normal mood and affect. His behavior is normal. Judgment and thought content normal.  Nursing note and vitals reviewed.  Diabetic Foot Exam - Simple   Simple Foot Form Diabetic Foot exam was performed with the following findings:  Yes 12/09/2017 11:13 AM  Visual Inspection See  comments:  Yes Sensation Testing Intact to touch and monofilament testing bilaterally:  Yes Pulse Check Posterior Tibialis and Dorsalis pulse intact bilaterally:  Yes Comments Maceration between L 4th/5th ties     Results for orders placed or performed in visit on 12/09/17  HM DIABETES EYE EXAM  Result Value Ref Range   HM Diabetic Eye Exam No Retinopathy No Retinopathy      Assessment & Plan:   Problem List Items Addressed This Visit    Advanced care planning/counseling discussion    Advanced directive - scanned 11/2016 - Delcie Roch (wife) then Thurnell Garbe (friend) are HCPOA. Jehova's witness. Ok with CPR and temporary life support but not prolonged if terminal condition. Ok with feeding tube.       Aortic atherosclerosis (HCC)    Continue aspirin, statin.      CAD (coronary artery disease)    Continue aspirin, statin.      Controlled diabetes mellitus with ophthalmic complication, without long-term current use of insulin (HCC)    Chronic, stable. Continue diet controlled. Reviewed diabetic diet. RTC 6 months lab visit for A1c, then will reassess in 1 year at next CPE.  Emphysema lung ([place])    By CT. Stable off medication. Denies dyspnea, cough, or recurrent bronchitis.      Ex-smoker    Reviewed first lung cancer screening CT 12/2016.       Health care maintenance - Primary    Preventative protocols reviewed and updated unless pt declined. Discussed healthy diet and lifestyle.       HLD (hyperlipidemia)    Chronic, stable. Continue simvastatin. The 10-year ASCVD risk score Mikey Bussing DC Brooke Bonito., et al., 2013) is: 19.9%   Values used to calculate the score:     Age: 24 years     Sex: Male     Is Non-Hispanic African American: Yes     Diabetic: Yes     Tobacco smoker: No     Systolic Blood Pressure: 358 mmHg     Is BP treated: No     HDL Cholesterol: 39.1 mg/dL     Total Cholesterol: 148 mg/dL       Tinea pedis    Discussed lotrimin use.          Follow up  plan: Return in about 1 year (around 12/09/2018) for annual exam, prior fasting for blood work.  Ria Bush, MD

## 2017-12-09 NOTE — Assessment & Plan Note (Addendum)
By CT. Stable off medication. Denies dyspnea, cough, or recurrent bronchitis.

## 2017-12-09 NOTE — Assessment & Plan Note (Signed)
Preventative protocols reviewed and updated unless pt declined. Discussed healthy diet and lifestyle.  

## 2017-12-09 NOTE — Assessment & Plan Note (Signed)
Continue aspirin, statin.  

## 2017-12-09 NOTE — Assessment & Plan Note (Signed)
Reviewed first lung cancer screening CT 12/2016.

## 2017-12-09 NOTE — Assessment & Plan Note (Addendum)
Advanced directive - scanned 11/2016 - Naomi (wife) then Tanya Harris (friend) are HCPOA. Jehova's witness. Ok with CPR and temporary life support but not prolonged if terminal condition. Ok with feeding tube.  ?

## 2017-12-09 NOTE — Assessment & Plan Note (Signed)
Chronic, stable. Continue simvastatin. The 10-year ASCVD risk score Mikey Bussing DC Brooke Bonito., et al., 2013) is: 19.9%   Values used to calculate the score:     Age: 72 years     Sex: Male     Is Non-Hispanic African American: Yes     Diabetic: Yes     Tobacco smoker: No     Systolic Blood Pressure: 841 mmHg     Is BP treated: No     HDL Cholesterol: 39.1 mg/dL     Total Cholesterol: 148 mg/dL

## 2017-12-09 NOTE — Assessment & Plan Note (Signed)
Chronic, stable. Continue diet controlled. Reviewed diabetic diet. RTC 6 months lab visit for A1c, then will reassess in 1 year at next CPE.

## 2017-12-09 NOTE — Patient Instructions (Addendum)
Colonoscopy will be due around 05/2018.  Repeat CT lung cancer screen will be due around 12/2016. If interested, check with pharmacy about new 2 shot shingles series (shingrix).  Use lotrimin between toes on left Return as needed or in 1 year for next physical.   Health Maintenance, Male A healthy lifestyle and preventive care is important for your health and wellness. Ask your health care provider about what schedule of regular examinations is right for you. What should I know about weight and diet? Eat a Healthy Diet  Eat plenty of vegetables, fruits, whole grains, low-fat dairy products, and lean protein.  Do not eat a lot of foods high in solid fats, added sugars, or salt.  Maintain a Healthy Weight Regular exercise can help you achieve or maintain a healthy weight. You should:  Do at least 150 minutes of exercise each week. The exercise should increase your heart rate and make you sweat (moderate-intensity exercise).  Do strength-training exercises at least twice a week.  Watch Your Levels of Cholesterol and Blood Lipids  Have your blood tested for lipids and cholesterol every 5 years starting at 71 years of age. If you are at high risk for heart disease, you should start having your blood tested when you are 71 years old. You may need to have your cholesterol levels checked more often if: ? Your lipid or cholesterol levels are high. ? You are older than 71 years of age. ? You are at high risk for heart disease.  What should I know about cancer screening? Many types of cancers can be detected early and may often be prevented. Lung Cancer  You should be screened every year for lung cancer if: ? You are a current smoker who has smoked for at least 30 years. ? You are a former smoker who has quit within the past 15 years.  Talk to your health care provider about your screening options, when you should start screening, and how often you should be screened.  Colorectal  Cancer  Routine colorectal cancer screening usually begins at 71 years of age and should be repeated every 5-10 years until you are 71 years old. You may need to be screened more often if early forms of precancerous polyps or small growths are found. Your health care provider may recommend screening at an earlier age if you have risk factors for colon cancer.  Your health care provider may recommend using home test kits to check for hidden blood in the stool.  A small camera at the end of a tube can be used to examine your colon (sigmoidoscopy or colonoscopy). This checks for the earliest forms of colorectal cancer.  Prostate and Testicular Cancer  Depending on your age and overall health, your health care provider may do certain tests to screen for prostate and testicular cancer.  Talk to your health care provider about any symptoms or concerns you have about testicular or prostate cancer.  Skin Cancer  Check your skin from head to toe regularly.  Tell your health care provider about any new moles or changes in moles, especially if: ? There is a change in a mole's size, shape, or color. ? You have a mole that is larger than a pencil eraser.  Always use sunscreen. Apply sunscreen liberally and repeat throughout the day.  Protect yourself by wearing long sleeves, pants, a wide-brimmed hat, and sunglasses when outside.  What should I know about heart disease, diabetes, and high blood pressure?  If you  are 13-51 years of age, have your blood pressure checked every 3-5 years. If you are 79 years of age or older, have your blood pressure checked every year. You should have your blood pressure measured twice-once when you are at a hospital or clinic, and once when you are not at a hospital or clinic. Record the average of the two measurements. To check your blood pressure when you are not at a hospital or clinic, you can use: ? An automated blood pressure machine at a pharmacy. ? A home blood  pressure monitor.  Talk to your health care provider about your target blood pressure.  If you are between 73-46 years old, ask your health care provider if you should take aspirin to prevent heart disease.  Have regular diabetes screenings by checking your fasting blood sugar level. ? If you are at a normal weight and have a low risk for diabetes, have this test once every three years after the age of 59. ? If you are overweight and have a high risk for diabetes, consider being tested at a younger age or more often.  A one-time screening for abdominal aortic aneurysm (AAA) by ultrasound is recommended for men aged 9-75 years who are current or former smokers. What should I know about preventing infection? Hepatitis B If you have a higher risk for hepatitis B, you should be screened for this virus. Talk with your health care provider to find out if you are at risk for hepatitis B infection. Hepatitis C Blood testing is recommended for:  Everyone born from [birth year] through 1964.  Anyone with known risk factors for hepatitis C.  Sexually Transmitted Diseases (STDs)  You should be screened each year for STDs including gonorrhea and chlamydia if: ? You are sexually active and are younger than 71 years of age. ? You are older than 71 years of age and your health care provider tells you that you are at risk for this type of infection. ? Your sexual activity has changed since you were last screened and you are at an increased risk for chlamydia or gonorrhea. Ask your health care provider if you are at risk.  Talk with your health care provider about whether you are at high risk of being infected with HIV. Your health care provider may recommend a prescription medicine to help prevent HIV infection.  What else can I do?  Schedule regular health, dental, and eye exams.  Stay current with your vaccines (immunizations).  Do not use any tobacco products, such as cigarettes, chewing tobacco, and  e-cigarettes. If you need help quitting, ask your health care provider.  Limit alcohol intake to no more than 2 drinks per day. One drink equals 12 ounces of beer, 5 ounces of wine, or 1 ounces of hard liquor.  Do not use street drugs.  Do not share needles.  Ask your health care provider for help if you need support or information about quitting drugs.  Tell your health care provider if you often feel depressed.  Tell your health care provider if you have ever been abused or do not feel safe at home. This information is not intended to replace advice given to you by your health care provider. Make sure you discuss any questions you have with your health care provider. Document Released: 04/24/2008 Document Revised: 06/25/2016 Document Reviewed: 07/31/2015 Elsevier Interactive Patient Education  [employer].

## 2017-12-09 NOTE — Assessment & Plan Note (Signed)
Discussed lotrimin use.  

## 2017-12-14 ENCOUNTER — Ambulatory Visit: Payer: 59 | Admitting: Family Medicine

## 2017-12-16 ENCOUNTER — Telehealth: Payer: Self-pay | Admitting: *Deleted

## 2017-12-16 NOTE — Telephone Encounter (Signed)
Left message for patient to notify them that it is time to schedule annual low dose lung cancer screening CT scan. Instructed patient to call back to verify information prior to the scan being scheduled.  

## 2017-12-28 ENCOUNTER — Ambulatory Visit: Payer: 59 | Admitting: Family Medicine

## 2017-12-28 ENCOUNTER — Other Ambulatory Visit: Payer: Self-pay

## 2017-12-28 ENCOUNTER — Encounter: Payer: Self-pay | Admitting: Family Medicine

## 2017-12-28 VITALS — BP 120/64 | HR 66 | Temp 98.6°F | Ht 69.75 in | Wt 196.0 lb

## 2017-12-28 DIAGNOSIS — S43102D Unspecified dislocation of left acromioclavicular joint, subsequent encounter: Secondary | ICD-10-CM

## 2017-12-28 NOTE — Progress Notes (Signed)
Dr. Frederico Hamman T. Breaker Springer, MD, [facility] [place], [address] Phone: [phone] Fax: [phone]  12/28/2017  Patient: Edward Lester, MRN: [id number], DOB: September 25, 1946, 72 y.o.  Primary Physician:  Ria Bush, MD   Chief Complaint  Patient presents with  . Shoulder Pain    Left   Subjective:   Edward Lester is a 72 y.o. very pleasant male patient who presents with the following:  F/u L shoulder pain and AC joint injury.   He had a traumatic [place], and he fell on the point of his left shoulder.  He was initially seen in urgent care, and had x-rays at that time.  They are not available for my independent review, but the interpreting radiologist felt that there was no fracture.  Since this time, he has had improving pain and range of motion.  He still has pain with flexion of the shoulder as well as terminal internal range of motion.  His strength is been improving throughout.  Past Medical History, Surgical History, Social History, Family History, Problem List, Medications, and Allergies have been reviewed and updated if relevant.  Patient Active Problem List   Diagnosis Date Noted  . Tinea pedis 12/09/2017  . Injury of left shoulder 11/30/2017  . Aortic atherosclerosis ([place]) 12/29/2016  . CAD (coronary artery disease) 12/29/2016  . Emphysema lung ([place]) 12/29/2016  . Pulmonary nodule 12/29/2016  . Carpal tunnel syndrome on right 12/29/2016  . Personal history of tobacco use, presenting hazards to health 12/23/2016  . Ex-smoker 12/08/2016  . Health care maintenance 11/20/2014  . Advanced care planning/counseling discussion 11/20/2014  . Controlled diabetes mellitus with ophthalmic complication, without long-term current use of insulin ([place]) 11/11/2014  . Osteoarthritis of right knee 09/04/2014  . HLD (hyperlipidemia)   . ED (erectile dysfunction) of organic origin      Past Medical History:  Diagnosis Date  . Aortic atherosclerosis ([place]) 12/29/2016   By CT  . CAD (coronary artery disease) 12/29/2016   By CT  . Diabetes mellitus type 2, controlled, without complications ([place]) 11/13/9700  . ED (erectile dysfunction) of organic origin   . Emphysema lung ([place]) 12/29/2016   Moderate changes of centrilobular and paraseptal emphysema identified. Diffuse bronchial wall thickening noted. By CT 12/2016  . Ex-smoker quit ~2005   30 PY hx  . History of colonic polyps   . HLD (hyperlipidemia)   . Osteoarthritis of knee 2015   severe predominantly R  . Refusal of blood transfusions as patient is Jehovah's Witness     Past Surgical History:  Procedure Laterality Date  . CARPAL TUNNEL RELEASE Left 2013  . COLONOSCOPY  2009   1 hyperplastic polyp [facility])  . FOOT SURGERY Left 2014   bone seperation on little toe  . LIPOMA EXCISION  2005   right back    Social History   Socioeconomic History  . Marital status: Married    Spouse name: Not on file  . Number of children: 4  . Years of education: Not on file  . Highest education level: Not on file  Social Needs  . Financial resource strain: Not on file  . Food insecurity - worry: Not on file  . Food insecurity - inability: Not on file  . Transportation needs - medical: Not on file  . Transportation needs - non-medical: Not on file  Occupational History  . Occupation: Biochemist, clinical  Tobacco  Use  . Smoking status: Former Smoker    Packs/day: 1.00    Years: 30.00    Pack years: 30.00    Types: Cigarettes    Start date: 11/11/1967    Last attempt to quit: 11/11/2003    Years since quitting: 14.1  . Smokeless tobacco: Never Used  Substance and Sexual Activity  . Alcohol use: Yes    Comment: social  . Drug use: No  . Sexual activity: Not on file  Other Topics Concern  . Not on file  Social History Narrative   Lives with wife   Grown children   Jehova's witness   Occupation: Web designer rep - Education officer, museum   Edu: college   Activity: golfing   Diet: good water, fruits/vegetables daily    Family History  Problem Relation Age of Onset  . Lung cancer Mother 71       (smoker)  . Stomach cancer Father 66       (smoker)  . Thyroid disease Sister   . CAD Neg Hx   . Stroke Neg Hx   . Diabetes Neg Hx     No Known Allergies  Medication list reviewed and updated in full in [place].  GEN: No fevers, chills. Nontoxic. Primarily MSK c/o today. MSK: Detailed in the HPI GI: tolerating PO intake without difficulty Neuro: No numbness, parasthesias, or tingling associated. Otherwise the pertinent positives of the ROS are noted above.   Objective:   BP 120/64   Pulse 66   Temp 98.6 F (37 C) (Oral)   Ht 5' 9.75" (1.772 m)   Wt 196 lb (88.9 kg)   BMI 28.33 kg/m    GEN: WDWN, NAD, Non-toxic, Alert & Oriented x 3 HEENT: Atraumatic, Normocephalic.  Ears and Nose: No external deformity. EXTR: No clubbing/cyanosis/edema NEURO: Normal gait.  PSYCH: Normally interactive. Conversant. Not depressed or anxious appearing.  Calm demeanor.    Left shoulder: Full range of motion in abduction.  Flexion with pain but full range of motion.  Pain with terminal internal range of motion.  Full external range of motion. There is a small degree of mechanical clicking at the [facility] joint.  Crossover is positive.  Abduction is 4+/5. Flexion is 4-/5 Internal and external rotation are both 5/5.  Drop test negative  Radiology: No results found.  Assessment and Plan:   AC separation, type 2, left, subsequent encounter   Progression is fairly normal for this type of injury.  I reviewed the anatomy with him with the anatomical model.  He is having some movement at the [facility] joint, and likely represents a type II injury, but does not have enough movement to be a type III.  This will do fine with conservative care and time.  He continues to improve.  I reviewed what he can  and cannot do in the gym with him face-to-face and also gave him some shoulder stability exercises which will likely help.  From my standpoint if he wants to play golf and get back to his regular activities he can, only limited by pain.  Follow-up: prn if needed.  Signed,  Maud Deed. Aulani Shipton, MD   Allergies as of 12/28/2017   No Known Allergies     Medication List        Accurate as of 12/28/17  3:29 PM. Always use your most recent med list.          aspirin EC 81 MG EC tablet Generic drug:  aspirin Take 81 mg by mouth daily. Reported on 12/03/2015   naproxen 500 MG tablet Commonly known as:  NAPROSYN Take 1 tablet (500 mg total) by mouth 2 (two) times daily with a meal. Take as needed   simvastatin 20 MG tablet Commonly known as:  ZOCOR Take 1 tablet (20 mg total) by mouth daily at 6 PM.

## 2017-12-30 ENCOUNTER — Telehealth: Payer: Self-pay | Admitting: *Deleted

## 2017-12-30 NOTE — Telephone Encounter (Signed)
Left message for patient to notify them that it is time to schedule annual low dose lung cancer screening CT scan. Instructed patient to call back to verify information prior to the scan being scheduled.  

## 2018-01-04 ENCOUNTER — Telehealth: Payer: Self-pay | Admitting: *Deleted

## 2018-01-04 NOTE — Telephone Encounter (Signed)
Left message for patient to notify them that it is time to schedule annual low dose lung cancer screening CT scan. Instructed patient to call back to verify information prior to the scan being scheduled.  

## 2018-01-05 ENCOUNTER — Telehealth: Payer: Self-pay | Admitting: *Deleted

## 2018-01-05 DIAGNOSIS — Z122 Encounter for screening for malignant neoplasm of respiratory organs: Secondary | ICD-10-CM

## 2018-01-05 DIAGNOSIS — Z87891 Personal history of nicotine dependence: Secondary | ICD-10-CM

## 2018-01-05 NOTE — Telephone Encounter (Signed)
Notified patient that annual lung cancer screening low dose CT scan is due currently or will be in near future. Confirmed that patient is within the age range of 55-77, and asymptomatic, (no signs or symptoms of lung cancer). Patient denies illness that would prevent curative treatment for lung cancer if found. Verified smoking history, (former, quit 2005, 30 pack year). The shared decision making visit was done 12/23/16. Patient is agreeable for CT scan being scheduled.

## 2018-01-09 ENCOUNTER — Other Ambulatory Visit: Payer: Self-pay | Admitting: Family Medicine

## 2018-01-13 ENCOUNTER — Ambulatory Visit: Admission: RE | Admit: 2018-01-13 | Payer: 59 | Source: Ambulatory Visit

## 2018-01-13 ENCOUNTER — Ambulatory Visit
Admission: RE | Admit: 2018-01-13 | Discharge: 2018-01-13 | Disposition: A | Discharge status: Home / Self Care | Payer: 59 | Source: Ambulatory Visit | Attending: Nurse Practitioner | Admitting: Nurse Practitioner

## 2018-01-13 DIAGNOSIS — J432 Centrilobular emphysema: Secondary | ICD-10-CM | POA: Diagnosis not present

## 2018-01-13 DIAGNOSIS — J438 Other emphysema: Secondary | ICD-10-CM | POA: Insufficient documentation

## 2018-01-13 DIAGNOSIS — I2510 Atherosclerotic heart disease of native coronary artery without angina pectoris: Secondary | ICD-10-CM | POA: Diagnosis not present

## 2018-01-13 DIAGNOSIS — Z87891 Personal history of nicotine dependence: Secondary | ICD-10-CM | POA: Diagnosis present

## 2018-01-13 DIAGNOSIS — I700 Atherosclerosis of aorta: Secondary | ICD-10-CM | POA: Diagnosis not present

## 2018-01-13 DIAGNOSIS — Z122 Encounter for screening for malignant neoplasm of respiratory organs: Secondary | ICD-10-CM | POA: Diagnosis present

## 2018-01-20 ENCOUNTER — Encounter: Payer: Self-pay | Admitting: *Deleted

## 2018-04-07 ENCOUNTER — Encounter: Payer: Self-pay | Admitting: Family Medicine

## 2018-04-07 ENCOUNTER — Ambulatory Visit: Payer: 59 | Admitting: Family Medicine

## 2018-04-07 ENCOUNTER — Ambulatory Visit (INDEPENDENT_AMBULATORY_CARE_PROVIDER_SITE_OTHER)
Admission: RE | Admit: 2018-04-07 | Discharge: 2018-04-07 | Disposition: A | Discharge status: Home / Self Care | Payer: 59 | Source: Ambulatory Visit | Attending: Family Medicine | Admitting: Family Medicine

## 2018-04-07 VITALS — BP 134/66 | HR 61 | Temp 98.5°F | Ht 69.75 in | Wt 196.2 lb

## 2018-04-07 DIAGNOSIS — M1711 Unilateral primary osteoarthritis, right knee: Secondary | ICD-10-CM

## 2018-04-07 NOTE — Patient Instructions (Signed)
You have wear and tear arthritis flare - osteoarthritis flare.  Steroid shot helped. May continue naprosyn as needed, ice knee, and use knee sleeve brace for extra support. xrays of right knee today.  Let us know if not improving over time.  Limit too much weight bearing activity - conserve cartilage.

## 2018-04-07 NOTE — Progress Notes (Signed)
BP 134/66 (BP Location: Left Arm, Patient Position: Sitting, Cuff Size: Normal)   Pulse 61   Temp 98.5 F (36.9 C) (Oral)   Ht 5' 9.75" (1.772 m)   Wt 196 lb 4 oz (89 kg)   SpO2 94%   BMI 28.36 kg/m    CC: f/u knee Subjective:    Patient ID: Edward Lester, male    DOB: Aug 30, 1946, 72 y.o.   MRN: [id number]  HPI: Edward Lester is a 72 y.o. male presenting on 04/07/2018 for Knee Pain (Seen at [facility] on 04/04/18 due to right knee pain. Was given inj. Now has swelling in anterior knee. Pt provided visit summary. 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 R knee pain/swelling started on Thursday 04/01/2018. Knee feels tight. No redness or warmth. Has been using ice and naprosyn. [facility] with R knee steroid injection. Did not have this drained. Has felt beter each day since knee injection.   Known R knee OA.  No h/o gout.   Relevant past medical, surgical, family and social history reviewed and updated as indicated. Interim medical history since our last visit reviewed. Allergies and medications reviewed and updated. Outpatient Medications Prior to Visit  Medication Sig Dispense Refill  . simvastatin (ZOCOR) 20 MG tablet Take 1 tablet (20 mg total) by mouth daily at 6 PM. 90 tablet 3  . aspirin (ASPIRIN EC) 81 MG EC tablet Take 81 mg by mouth daily. Reported on 12/03/2015    . naproxen (NAPROSYN) 500 MG tablet TAKE 1 TABLET (500 MG TOTAL) BY MOUTH 2 (TWO) TIMES DAILY WITH A MEAL IF NEEDED 60 tablet 0   No facility-administered medications prior to visit.      Per HPI unless specifically indicated in ROS section below Review of Systems     Objective:    BP 134/66 (BP Location: Left Arm, Patient Position: Sitting, Cuff Size: Normal)   Pulse 61   Temp 98.5 F (36.9 C) (Oral)   Ht 5' 9.75" (1.772 m)   Wt 196 lb 4 oz (89 kg)   SpO2 94%   BMI 28.36 kg/m   Wt Readings from Last 3 Encounters:  04/07/18 196 lb 4 oz (89 kg)    01/13/18 190 lb (86.2 kg)  12/28/17 196 lb (88.9 kg)    Physical Exam  Constitutional: He appears well-developed and well-nourished. No distress.  Musculoskeletal: He exhibits edema.  L knee WNL R knee exam: No deformity on inspection. Pain with palpation of medial and lateral joint lines + effusion/swelling noted. FROM in extension without crepitus, limited flexion pats 90 degrees due to tightness. No popliteal fullness. Neg drawer test. Discomfort with mcmurray test. No pain with valgus/varus stress. No PFgrind. No abnormal patellar mobility.   Skin: Skin is warm and dry. No rash noted.  Nursing note and vitals reviewed.  Results for orders placed or performed in visit on 12/09/17  HM DIABETES EYE EXAM  Result Value Ref Range   HM Diabetic Eye Exam No Retinopathy No Retinopathy      Assessment & Plan:   Problem List Items Addressed This Visit    Osteoarthritis of right knee - Primary    Anticipate knee OA flare. He did receive steroid injection Sunday with benefit from this. Reviewed naprosyn use, ice, and recommended start using compression knee sleeve brace. Update if not improving with treatment. Check xray today to eval for progression of OA.  Relevant Orders   DG Knee Complete 4 Views Right       No orders of the defined types were placed in this encounter.  Orders Placed This Encounter  Procedures  . DG Knee Complete 4 Views Right    Standing Status:   Future    Number of Occurrences:   1    Standing Expiration Date:   06/08/2019    Order Specific Question:   Reason for Exam (SYMPTOM  OR DIAGNOSIS REQUIRED)    Answer:   R knee pain/swelling anticipate OA flare    Order Specific Question:   Preferred imaging location?    Answer:   [facility]    Order Specific Question:   Radiology Contrast Protocol - do NOT remove file path    Answer:   [email or web address]    Follow up plan: Return if symptoms worsen  or fail to improve.  Ria Bush, MD

## 2018-04-07 NOTE — Assessment & Plan Note (Signed)
Anticipate knee OA flare. He did receive steroid injection Sunday with benefit from this. Reviewed naprosyn use, ice, and recommended start using compression knee sleeve brace. Update if not improving with treatment. Check xray today to eval for progression of OA.

## 2018-05-27 ENCOUNTER — Encounter: Payer: Self-pay | Admitting: Family Medicine

## 2018-05-27 ENCOUNTER — Telehealth: Payer: Self-pay | Admitting: Family Medicine

## 2018-05-27 NOTE — Telephone Encounter (Signed)
PT came in requesting info from 04/07/18 x-ray on knee. He said he has not heard anything and his knee is still swollen and painful. He is currently wearing the brace. FYI- I reset MyChart password since pt was unable to login.

## 2018-05-27 NOTE — Telephone Encounter (Signed)
He has progressive knee osteoarthritis.  If not doing better, next step is either referral to sports medicine to discuss injection options or referral to ortho to discuss possible surgical options.  Let us know which he'd like to do.

## 2018-05-27 NOTE — Telephone Encounter (Signed)
Left message for pt to call back.  Need to relay Dr. Synthia Innocent message and document pt choice of referral.

## 2018-05-28 NOTE — Telephone Encounter (Signed)
Spoke with pt relaying Dr. Synthia Innocent message.  Pt states he has appt with Dr. Lorelei Pont on Mon, 05/31/18.

## 2018-05-31 ENCOUNTER — Encounter: Payer: Self-pay | Admitting: Family Medicine

## 2018-05-31 ENCOUNTER — Ambulatory Visit: Payer: 59 | Admitting: Family Medicine

## 2018-05-31 VITALS — BP 122/80 | HR 56 | Temp 98.0°F | Ht 69.75 in | Wt 194.2 lb

## 2018-05-31 DIAGNOSIS — M1711 Unilateral primary osteoarthritis, right knee: Secondary | ICD-10-CM

## 2018-05-31 MED ORDER — TRAMADOL HCL 50 MG PO TABS: 50.0000 mg | ORAL_TABLET | Freq: Four times a day (QID) | ORAL | 2 refills | Status: DC | PRN

## 2018-05-31 NOTE — Patient Instructions (Signed)
OSTEOARTHRITIS:  For symptomatic relief: Tylenol: 2 tablets up to 3-4 times a day Regular NSAIDS are helpful (avoid in kidney disease and ulcers) Topical Capzaicin Cream, as needed (wear glove to put on)  For flares, corticosteroid injections help. Hyaluronic Acid injections have good success, average relief is 6 months Glucosamine and Chondroitin often helpful - will take about 3 months to see if you have an effect. If you do, great, keep them up, if none at that point, no need to take in the future.   Omega-3 fish oils may help, 2 grams daily Ice joints on bad days, 20 min, 2-3 x / day REGULAR EXERCISE: swimming, Yoga, Tai Chi, bicycle (NON-IMPACT activity)

## 2018-05-31 NOTE — Progress Notes (Signed)
Dr. Frederico Hamman T. Vaun Hyndman, MD, [facility] and Sports Medicine [place], [address] Phone: [phone] Fax: [phone]  05/31/2018  Patient: Edward Lester, MRN: [id number], DOB: 1946/04/09, 72 y.o.  Primary Physician:  Ria Bush, MD   Chief Complaint  Patient presents with  . Knee Pain    right, has had x-rays   Subjective:   Edward Lester is a 72 y.o. very pleasant male patient who presents with the following:  5/26 R knee steroid injection  Arthritis on the R knee, had an injection, never really got any better.  Pain at night.  Wearing a brace with playing golfing.   Pleasant gentleman, 72 years old who appears younger than stated age.  He does have some medical problems and has had some what appears to be coronary disease as well as emphysema, but he is still very active, he likes to play golf.  He has been having some quite significant pain in his right knee, and on May 26, he went to an urgent care center and had a intra-articular knee injection.  This did seem to help a little bit.  He also has been taken some Naprosyn, but he is still had symptoms much of the time at baseline.  He has pain at night.  He also has pain when he gets up first thing in the morning.  He does have a limp at baseline and is not having full extension or flexion in that knee at all.  He is wearing a brace when he is playing golf right now, that does seem to help.  Past Medical History, Surgical History, Social History, Family History, Problem List, Medications, and Allergies have been reviewed and updated if relevant.  Patient Active Problem List   Diagnosis Date Noted  . Tinea pedis 12/09/2017  . Injury of left shoulder 11/30/2017  . Aortic atherosclerosis ([place]) 12/29/2016  . CAD (coronary artery disease) 12/29/2016  . Emphysema lung ([place]) 12/29/2016  . Pulmonary nodule 12/29/2016  . Carpal tunnel syndrome on right 12/29/2016  . Personal history of  tobacco use, presenting hazards to health 12/23/2016  . Ex-smoker 12/08/2016  . Health care maintenance 11/20/2014  . Advanced care planning/counseling discussion 11/20/2014  . Controlled diabetes mellitus with ophthalmic complication, without long-term current use of insulin ([place]) 11/11/2014  . Osteoarthritis of right knee 09/04/2014  . HLD (hyperlipidemia)   . ED (erectile dysfunction) of organic origin     Past Medical History:  Diagnosis Date  . Aortic atherosclerosis ([place]) 12/29/2016   By CT  . CAD (coronary artery disease) 12/29/2016   By CT  . Diabetes mellitus type 2, controlled, without complications ([place]) 01/14/6282  . ED (erectile dysfunction) of organic origin   . Emphysema lung ([place]) 12/29/2016   Moderate changes of centrilobular and paraseptal emphysema identified. Diffuse bronchial wall thickening noted. By CT 12/2016  . Ex-smoker quit ~2005   30 PY hx  . History of colonic polyps   . HLD (hyperlipidemia)   . Osteoarthritis of knee 2015   severe predominantly R  . Refusal of blood transfusions as patient is Jehovah's Witness     Past Surgical History:  Procedure Laterality Date  . CARPAL TUNNEL RELEASE Left 2013  . COLONOSCOPY  2009   1 hyperplastic polyp [facility])  . FOOT SURGERY Left 2014   bone seperation on little toe  . LIPOMA EXCISION  2005   right back    Social History   Socioeconomic History  .  Marital status: Married    Spouse name: Not on file  . Number of children: 4  . Years of education: Not on file  . Highest education level: Not on file  Occupational History  . Occupation: Biochemist, clinical  Social Needs  . Financial resource strain: Not on file  . Food insecurity:    Worry: Not on file    Inability: Not on file  . Transportation needs:    Medical: Not on file    Non-medical: Not on file  Tobacco Use  . Smoking status: Former Smoker    Packs/day: 1.00    Years: 30.00    Pack years: 30.00    Types: Cigarettes    Start date: 11/11/1967     Last attempt to quit: 11/11/2003    Years since quitting: 14.5  . Smokeless tobacco: Never Used  Substance and Sexual Activity  . Alcohol use: Yes    Comment: social  . Drug use: No  . Sexual activity: Not on file  Lifestyle  . Physical activity:    Days per week: Not on file    Minutes per session: Not on file  . Stress: Not on file  Relationships  . Social connections:    Talks on phone: Not on file    Gets together: Not on file    Attends religious service: Not on file    Active member of club or organization: Not on file    Attends meetings of clubs or organizations: Not on file    Relationship status: Not on file  . Intimate partner violence:    Fear of current or ex partner: Not on file    Emotionally abused: Not on file    Physically abused: Not on file    Forced sexual activity: Not on file  Other Topics Concern  . Not on file  Social History Narrative   Lives with wife   Grown children   Jehova's witness   Occupation: Printmaker rep - Education officer, museum   Edu: college   Activity: golfing   Diet: good water, fruits/vegetables daily    Family History  Problem Relation Age of Onset  . Lung cancer Mother 20       (smoker)  . Stomach cancer Father 85       (smoker)  . Thyroid disease Sister   . CAD Neg Hx   . Stroke Neg Hx   . Diabetes Neg Hx     No Known Allergies  Medication list reviewed and updated in full in [place].  GEN: No fevers, chills. Nontoxic. Primarily MSK c/o today. MSK: Detailed in the HPI GI: tolerating PO intake without difficulty Neuro: No numbness, parasthesias, or tingling associated. Otherwise the pertinent positives of the ROS are noted above.   Objective:   BP 122/80   Pulse (!) 56   Temp 98 F (36.7 C) (Oral)   Ht 5' 9.75" (1.772 m)   Wt 194 lb 4 oz (88.1 kg)   BMI 28.07 kg/m    GEN: WDWN, NAD, Non-toxic, Alert & Oriented x 3 HEENT: Atraumatic, Normocephalic.  Ears and Nose: No external  deformity. EXTR: No clubbing/cyanosis/edema NEURO: Normal gait.  PSYCH: Normally interactive. Conversant. Not depressed or anxious appearing.  Calm demeanor.    The patient lacks 4 degrees of extension.  He is able to flex the knee to 102 degrees.  There is a mild effusion.  Modest tenderness with patellar manipulation and pressure at the medial patellar and  lateral patellar facet joints.  He has mild medial greater than lateral joint line pain.  Stable Lockman as well as posterior drawer testing.  MCL and LCL are stable.  McMurray's test and flexion pinch testing is painful.  Radiology: Dg Knee Complete 4 Views Right  Result Date: 04/07/2018 CLINICAL DATA:  Right knee pain and swelling, osteoarthritis EXAM: RIGHT KNEE - COMPLETE 4+ VIEW COMPARISON:  09/04/2014 FINDINGS: Progressive tricompartmental severe right knee osteoarthritis with marked joint space loss, sclerosis and bony spurring. Medial compartment is most affected with bone-on-bone contact. No acute osseous finding, fracture or effusion. No definite soft tissue abnormality. IMPRESSION: Progressive severe right knee tricompartmental osteoarthritis, most severe in the medial compartment compare to 09/04/2014. No acute osseous finding or large effusion. Electronically Signed   By: Jerilynn Mages.  Shick M.D.   On: 04/07/2018 14:43   Assessment and Plan:   Primary osteoarthritis of right knee  >25 minutes spent in face to face time with patient, >50% spent in counselling or coordination of care   Basically, the patient has end-stage osteoarthritis of the right knee, with bone-on-bone pathology at least in the medial compartment, and globally severe tricompartmental osteoarthritis.  I reviewed treatment of overall osteoarthritis of the knee with the patient. Refer to the patient instructions sections for details of plan shared with patient.   He is fit and strong.  I think that basically he should try to maximize the use of his native knee, but at any  point he would be a candidate for a total knee arthroplasty.  For now, we are going to check coverage for hyaluronic acid injections he is going to continue with some conservative treatments.  Follow-up: 3 weeks  Meds ordered this encounter  Medications  . traMADol (ULTRAM) 50 MG tablet    Sig: Take 1 tablet (50 mg total) by mouth every 6 (six) hours as needed for up to 5 days.    Dispense:  20 tablet    Refill:  2   Signed,  Lativia Velie T. Mariadelcarmen Corella, MD   Allergies as of 05/31/2018   No Known Allergies     Medication List        Accurate as of 05/31/18  1:59 PM. Always use your most recent med list.          naproxen 500 MG tablet Commonly known as:  NAPROSYN Take 500 mg by mouth 2 (two) times daily with a meal.   simvastatin 20 MG tablet Commonly known as:  ZOCOR Take 1 tablet (20 mg total) by mouth daily at 6 PM.   traMADol 50 MG tablet Commonly known as:  ULTRAM Take 1 tablet (50 mg total) by mouth every 6 (six) hours as needed for up to 5 days.

## 2018-06-01 ENCOUNTER — Telehealth: Payer: Self-pay | Admitting: Family Medicine

## 2018-06-01 NOTE — Telephone Encounter (Signed)
Edward Lester, can you check with him and let him know he has only a 40 dollar co-pay on the hyaluronic acid injection that we talked about in the office.   He was going out of town. Can you schedule him for follow-up when he comes back at his convenience?  Electronically Signed  By: Owens Loffler, MD On: 06/01/2018 10:41 AM     Direct quote from Deirdre Peer  "Hi Dr. Lorelei Pont,   I spoke to [place] at [facility], ref [phone] and he informed me that either SynviscOne or Monovisc will be covered for a $40 co-pay, no deductible applies. I've included the ref # for my call just in case insurance tries to deny claim.   I am well and hope you are too!! :)   As always, thank you for your commitment to and hard work for our patients!   Rose"

## 2018-06-01 NOTE — Telephone Encounter (Signed)
Appointment for injection 8/1 @ 8:20 do you want 20 min for appointment or 40 min

## 2018-06-01 NOTE — Telephone Encounter (Signed)
Left message asking pt to call office  °

## 2018-06-07 ENCOUNTER — Other Ambulatory Visit: Payer: 59

## 2018-06-09 ENCOUNTER — Other Ambulatory Visit: Payer: Self-pay | Admitting: Family Medicine

## 2018-06-09 ENCOUNTER — Other Ambulatory Visit: Payer: 59

## 2018-06-09 ENCOUNTER — Ambulatory Visit: Payer: 59 | Admitting: Family Medicine

## 2018-06-09 DIAGNOSIS — E1136 Type 2 diabetes mellitus with diabetic cataract: Secondary | ICD-10-CM

## 2018-06-09 DIAGNOSIS — E785 Hyperlipidemia, unspecified: Secondary | ICD-10-CM

## 2018-06-10 ENCOUNTER — Encounter: Payer: Self-pay | Admitting: Family Medicine

## 2018-06-10 ENCOUNTER — Other Ambulatory Visit (INDEPENDENT_AMBULATORY_CARE_PROVIDER_SITE_OTHER): Payer: 59

## 2018-06-10 ENCOUNTER — Ambulatory Visit: Payer: 59 | Admitting: Family Medicine

## 2018-06-10 VITALS — BP 120/78 | HR 63 | Temp 97.8°F | Ht 69.75 in | Wt 190.0 lb

## 2018-06-10 DIAGNOSIS — M1711 Unilateral primary osteoarthritis, right knee: Secondary | ICD-10-CM

## 2018-06-10 DIAGNOSIS — E1136 Type 2 diabetes mellitus with diabetic cataract: Secondary | ICD-10-CM

## 2018-06-10 LAB — POCT GLYCOSYLATED HEMOGLOBIN (HGB A1C): Hemoglobin A1C: 6.2 % — AB (ref 4.0–5.6)

## 2018-06-10 MED ORDER — HYLAN G-F 20 48 MG/6ML IX SOSY
PREFILLED_SYRINGE | Freq: Once | INTRA_ARTICULAR | Status: AC
  Administered 2018-06-10: 48 mg via INTRA_ARTICULAR

## 2018-06-10 NOTE — Progress Notes (Signed)
   Dr. Frederico Hamman T. Swade Shonka, MD, [facility] Primary [facility] and Sports Medicine [place], [address] Phone: [phone] Fax: [phone]  06/10/2018  Patient: Edward Lester, MRN: [id number], DOB: 05-12-1946, 72 y.o.  Primary Physician:  Ria Bush, MD   Chief Complaint  Patient presents with  . Knee Pain    Right-Synvisc Injection    R Synvisc One Inj  Known OA  Knee Injection: Synvisc-One Patient verbally consented to procedure. Risks (including infection), benefits, and alternatives explained. Sterilely prepped with Chloraprep. Ethyl cholride used for anesthesia, then 7 cc of Lidocaine 1% used for anesthesia in the anterolateral position. Reprepped with Chloraprep.  Anterolateral approach used to inject joint without difficulty, injected with Synvisc-One, 6 mL. No complications with procedure and tolerated well.   Signed,  Maud Deed. Jerrion Tabbert, MD

## 2018-06-20 NOTE — Progress Notes (Signed)
Dr. Frederico Hamman T. Azlin Zilberman, MD, [facility] and Sports Medicine [place], [address] Phone: [phone] Fax: [phone]  06/21/2018  Patient: Edward Lester, MRN: [id number], DOB: 11-21-1945, 72 y.o.  Primary Physician:  Ria Bush, MD   Chief Complaint  Patient presents with  . Follow-up    Right Knee   Subjective:   Edward Lester is a 72 y.o. very pleasant male patient who presents with the following:  The patient is here in follow-up after a recent office visit with me on June 10, 2018.  At that point we did a Synvisc 1 injection in his right knee.  Prior to this, the patient had a corticosteroid injection in May 2019 in his right knee.  His x-rays are reviewed again and reviewed primarily by myself.  The patient has advanced tricompartmental osteoarthritis.  Here 06/21/18 again in follow-up:  Doing great and feeling really good. Pain level is much better.  Past Medical History, Surgical History, Social History, Family History, Problem List, Medications, and Allergies have been reviewed and updated if relevant.  Patient Active Problem List   Diagnosis Date Noted  . Tinea pedis 12/09/2017  . Injury of left shoulder 11/30/2017  . Aortic atherosclerosis ([place]) 12/29/2016  . CAD (coronary artery disease) 12/29/2016  . Emphysema lung ([place]) 12/29/2016  . Pulmonary nodule 12/29/2016  . Carpal tunnel syndrome on right 12/29/2016  . Personal history of tobacco use, presenting hazards to health 12/23/2016  . Ex-smoker 12/08/2016  . Health care maintenance 11/20/2014  . Advanced care planning/counseling discussion 11/20/2014  . Controlled diabetes mellitus with ophthalmic complication, without long-term current use of insulin ([place]) 11/11/2014  . Osteoarthritis of right knee 09/04/2014  . HLD (hyperlipidemia)   . ED (erectile dysfunction) of organic origin     Past Medical History:  Diagnosis Date  . Aortic atherosclerosis ([place])  12/29/2016   By CT  . CAD (coronary artery disease) 12/29/2016   By CT  . Diabetes mellitus type 2, controlled, without complications ([place]) 03/18/1637  . ED (erectile dysfunction) of organic origin   . Emphysema lung ([place]) 12/29/2016   Moderate changes of centrilobular and paraseptal emphysema identified. Diffuse bronchial wall thickening noted. By CT 12/2016  . Ex-smoker quit ~2005   30 PY hx  . History of colonic polyps   . HLD (hyperlipidemia)   . Osteoarthritis of knee 2015   severe predominantly R  . Refusal of blood transfusions as patient is Jehovah's Witness     Past Surgical History:  Procedure Laterality Date  . CARPAL TUNNEL RELEASE Left 2013  . COLONOSCOPY  2009   1 hyperplastic polyp [facility])  . FOOT SURGERY Left 2014   bone seperation on little toe  . LIPOMA EXCISION  2005   right back    Social History   Socioeconomic History  . Marital status: Married    Spouse name: Not on file  . Number of children: 4  . Years of education: Not on file  . Highest education level: Not on file  Occupational History  . Occupation: Biochemist, clinical  Social Needs  . Financial resource strain: Not on file  . Food insecurity:    Worry: Not on file    Inability: Not on file  . Transportation needs:    Medical: Not on file    Non-medical: Not on file  Tobacco Use  . Smoking status: Former Smoker    Packs/day: 1.00    Years: 30.00  Pack years: 30.00    Types: Cigarettes    Start date: 11/11/1967    Last attempt to quit: 11/11/2003    Years since quitting: 14.6  . Smokeless tobacco: Never Used  Substance and Sexual Activity  . Alcohol use: Yes    Comment: social  . Drug use: No  . Sexual activity: Not on file  Lifestyle  . Physical activity:    Days per week: Not on file    Minutes per session: Not on file  . Stress: Not on file  Relationships  . Social connections:    Talks on phone: Not on file    Gets together: Not on file    Attends religious service: Not on  file    Active member of club or organization: Not on file    Attends meetings of clubs or organizations: Not on file    Relationship status: Not on file  . Intimate partner violence:    Fear of current or ex partner: Not on file    Emotionally abused: Not on file    Physically abused: Not on file    Forced sexual activity: Not on file  Other Topics Concern  . Not on file  Social History Narrative   Lives with wife   Grown children   Jehova's witness   Occupation: Printmaker rep - Education officer, museum   Edu: college   Activity: golfing   Diet: good water, fruits/vegetables daily    Family History  Problem Relation Age of Onset  . Lung cancer Mother 58       (smoker)  . Stomach cancer Father 68       (smoker)  . Thyroid disease Sister   . CAD Neg Hx   . Stroke Neg Hx   . Diabetes Neg Hx     No Known Allergies  Medication list reviewed and updated in full in [place].  GEN: No fevers, chills. Nontoxic. Primarily MSK c/o today. MSK: Detailed in the HPI GI: tolerating PO intake without difficulty Neuro: No numbness, parasthesias, or tingling associated. Otherwise the pertinent positives of the ROS are noted above.   Objective:   BP (!) 160/90   Pulse 67   Temp 98 F (36.7 C) (Oral)   Ht 5' 9.75" (1.772 m)   Wt 195 lb 4 oz (88.6 kg)   BMI 28.22 kg/m    GEN: WDWN, NAD, Non-toxic, Alert & Oriented x 3 HEENT: Atraumatic, Normocephalic.  Ears and Nose: No external deformity. EXTR: No clubbing/cyanosis/edema NEURO: Normal gait.  PSYCH: Normally interactive. Conversant. Not depressed or anxious appearing.  Calm demeanor.   Knee:  R Gait: Normal heel toe pattern ROM: lacks 4 deg extension, flexion to 95 Effusion: mild Echymosis or edema: none Patellar tendon NT Painful PLICA: neg Patellar grind: negative Medial and lateral patellar facet loading: negative medial and lateral joint lines:NT Mcmurray's neg Flexion-pinch neg Varus and valgus  stress: stable Lachman: neg Ant and Post drawer: neg Hip abduction, IR, ER: WNL Hip flexion str: 5/5 Hip abd: 5/5 Quad: 5/5 VMO atrophy:No Hamstring concentric and eccentric: 5/5   Radiology: No results found.  Assessment and Plan:   Primary osteoarthritis of right knee  Doing well after synvisc, good response.  Cont with golf and hep  Follow-up: No follow-ups on file.  Signed,  Maud Deed. Virgil Lightner, MD   Allergies as of 06/21/2018   No Known Allergies     Medication List        Accurate  as of 06/21/18  9:05 AM. Always use your most recent med list.          naproxen 500 MG tablet Commonly known as:  NAPROSYN Take 500 mg by mouth 2 (two) times daily with a meal.   simvastatin 20 MG tablet Commonly known as:  ZOCOR Take 1 tablet (20 mg total) by mouth daily at 6 PM.

## 2018-06-21 ENCOUNTER — Ambulatory Visit: Payer: 59 | Admitting: Family Medicine

## 2018-06-21 ENCOUNTER — Encounter: Payer: Self-pay | Admitting: Family Medicine

## 2018-06-21 VITALS — BP 160/90 | HR 67 | Temp 98.0°F | Ht 69.75 in | Wt 195.2 lb

## 2018-06-21 DIAGNOSIS — M1711 Unilateral primary osteoarthritis, right knee: Secondary | ICD-10-CM

## 2018-06-28 ENCOUNTER — Other Ambulatory Visit: Payer: Self-pay | Admitting: Family Medicine

## 2018-11-16 ENCOUNTER — Other Ambulatory Visit: Payer: Self-pay | Admitting: Family Medicine

## 2018-11-22 ENCOUNTER — Other Ambulatory Visit: Payer: Self-pay | Admitting: Family Medicine

## 2018-11-22 NOTE — Telephone Encounter (Signed)
Naproxen Last filled:  10/20/18, #60/0 Last OV:  06/21/18, f/u with Dr. Lorelei Pont Next OV:  12/15/18, CPE

## 2018-12-12 ENCOUNTER — Other Ambulatory Visit: Payer: Self-pay | Admitting: Family Medicine

## 2018-12-12 DIAGNOSIS — E785 Hyperlipidemia, unspecified: Secondary | ICD-10-CM

## 2018-12-12 DIAGNOSIS — E1136 Type 2 diabetes mellitus with diabetic cataract: Secondary | ICD-10-CM

## 2018-12-12 DIAGNOSIS — Z125 Encounter for screening for malignant neoplasm of prostate: Secondary | ICD-10-CM

## 2018-12-13 ENCOUNTER — Other Ambulatory Visit: Payer: 59

## 2018-12-14 ENCOUNTER — Other Ambulatory Visit (INDEPENDENT_AMBULATORY_CARE_PROVIDER_SITE_OTHER): Payer: 59

## 2018-12-14 DIAGNOSIS — Z125 Encounter for screening for malignant neoplasm of prostate: Secondary | ICD-10-CM | POA: Diagnosis not present

## 2018-12-14 DIAGNOSIS — E1136 Type 2 diabetes mellitus with diabetic cataract: Secondary | ICD-10-CM | POA: Diagnosis not present

## 2018-12-14 DIAGNOSIS — E785 Hyperlipidemia, unspecified: Secondary | ICD-10-CM | POA: Diagnosis not present

## 2018-12-14 LAB — COMPREHENSIVE METABOLIC PANEL
ALBUMIN: 4 g/dL (ref 3.5–5.2)
ALT: 15 U/L (ref 0–53)
AST: 17 U/L (ref 0–37)
Alkaline Phosphatase: 66 U/L (ref 39–117)
BUN: 13 mg/dL (ref 6–23)
CALCIUM: 9.1 mg/dL (ref 8.4–10.5)
CHLORIDE: 106 meq/L (ref 96–112)
CO2: 26 meq/L (ref 19–32)
Creatinine, Ser: 0.96 mg/dL (ref 0.40–1.50)
GFR: 93.31 mL/min (ref >60.00)
Glucose, Bld: 140 mg/dL — ABNORMAL HIGH (ref 70–99)
Potassium: 4.3 mEq/L (ref 3.5–5.1)
Sodium: 139 mEq/L (ref 135–145)
Total Bilirubin: 0.6 mg/dL (ref 0.2–1.2)
Total Protein: 6.9 g/dL (ref 6.0–8.3)

## 2018-12-14 LAB — LIPID PANEL
CHOL/HDL RATIO: 3
CHOLESTEROL: 143 mg/dL (ref 0–200)
HDL: 40.9 mg/dL (ref >39.00)
LDL Cholesterol: 85 mg/dL (ref 0–99)
NonHDL: 101.8
TRIGLYCERIDES: 84 mg/dL (ref 0.0–149.0)
VLDL: 16.8 mg/dL (ref 0.0–40.0)

## 2018-12-14 LAB — MICROALBUMIN / CREATININE URINE RATIO
Creatinine,U: 167.4 mg/dL
MICROALB UR: 1.7 mg/dL (ref 0.0–1.9)
MICROALB/CREAT RATIO: 1 mg/g (ref 0.0–30.0)

## 2018-12-14 LAB — PSA: PSA: 0.99 ng/mL (ref 0.10–4.00)

## 2018-12-14 LAB — HEMOGLOBIN A1C: HEMOGLOBIN A1C: 5.5 % (ref 4.6–6.5)

## 2018-12-15 ENCOUNTER — Ambulatory Visit (INDEPENDENT_AMBULATORY_CARE_PROVIDER_SITE_OTHER): Payer: 59 | Admitting: Family Medicine

## 2018-12-15 ENCOUNTER — Encounter: Payer: Self-pay | Admitting: Family Medicine

## 2018-12-15 VITALS — BP 120/82 | HR 72 | Temp 97.6°F | Ht 69.0 in | Wt 193.5 lb

## 2018-12-15 DIAGNOSIS — M79642 Pain in left hand: Secondary | ICD-10-CM

## 2018-12-15 DIAGNOSIS — E785 Hyperlipidemia, unspecified: Secondary | ICD-10-CM | POA: Diagnosis not present

## 2018-12-15 DIAGNOSIS — Z0000 Encounter for general adult medical examination without abnormal findings: Secondary | ICD-10-CM | POA: Diagnosis not present

## 2018-12-15 DIAGNOSIS — I700 Atherosclerosis of aorta: Secondary | ICD-10-CM

## 2018-12-15 DIAGNOSIS — Z23 Encounter for immunization: Secondary | ICD-10-CM

## 2018-12-15 DIAGNOSIS — Z1211 Encounter for screening for malignant neoplasm of colon: Secondary | ICD-10-CM | POA: Diagnosis not present

## 2018-12-15 DIAGNOSIS — I2510 Atherosclerotic heart disease of native coronary artery without angina pectoris: Secondary | ICD-10-CM

## 2018-12-15 DIAGNOSIS — J432 Centrilobular emphysema: Secondary | ICD-10-CM

## 2018-12-15 DIAGNOSIS — M79641 Pain in right hand: Secondary | ICD-10-CM

## 2018-12-15 DIAGNOSIS — E1136 Type 2 diabetes mellitus with diabetic cataract: Secondary | ICD-10-CM

## 2018-12-15 DIAGNOSIS — Z87891 Personal history of nicotine dependence: Secondary | ICD-10-CM

## 2018-12-15 LAB — CBC WITH DIFFERENTIAL/PLATELET
Basophils Absolute: 0 10*3/uL (ref 0.0–0.1)
Basophils Relative: 0.8 % (ref 0.0–3.0)
EOS PCT: 2.5 % (ref 0.0–5.0)
Eosinophils Absolute: 0.1 10*3/uL (ref 0.0–0.7)
HCT: 43.8 % (ref 39.0–52.0)
Hemoglobin: 14.9 g/dL (ref 13.0–17.0)
LYMPHS ABS: 2.3 10*3/uL (ref 0.7–4.0)
Lymphocytes Relative: 44 % (ref 12.0–46.0)
MCHC: 34 g/dL (ref 30.0–36.0)
MCV: 91.3 fl (ref 78.0–100.0)
MONOS PCT: 9.9 % (ref 3.0–12.0)
Monocytes Absolute: 0.5 10*3/uL (ref 0.1–1.0)
NEUTROS ABS: 2.2 10*3/uL (ref 1.4–7.7)
NEUTROS PCT: 42.8 % — AB (ref 43.0–77.0)
PLATELETS: 232 10*3/uL (ref 150.0–400.0)
RBC: 4.8 Mil/uL (ref 4.22–5.81)
RDW: 14 % (ref 11.5–15.5)
WBC: 5.1 10*3/uL (ref 4.0–10.5)

## 2018-12-15 NOTE — Assessment & Plan Note (Signed)
Anticipate OA related. Denies synovitis symptoms. Continue to monitor with PRN naprosyn.

## 2018-12-15 NOTE — Assessment & Plan Note (Addendum)
Marked improvement over last 6 months - now normal range sugar control. Congratulated. Pt attributes to pumpkin seed oil. Will check fructosamine and CBC to verify accurate A1c reading. Consider resolving problem if remains well controlled. RTC 6 mo f/u visit per pt request.

## 2018-12-15 NOTE — Assessment & Plan Note (Signed)
By CT. Mild emphysema. Pt asxs.

## 2018-12-15 NOTE — Assessment & Plan Note (Signed)
Remains abstinent. Continues lung cancer screening.

## 2018-12-15 NOTE — Assessment & Plan Note (Signed)
By CT. Continue statin.

## 2018-12-15 NOTE — Patient Instructions (Addendum)
Flu shot today Recheck labs today.  We will refer you to [facility].  If interested, check with pharmacy about new 2 shot shingles series (shingrix).  You are doing well today. Return as needed or in 6 months for follow up visit.  Health Maintenance After Age 72 After age 58, you are at a higher risk for certain long-term diseases and infections as well as injuries from falls. Falls are a major cause of broken bones and head injuries in people who are older than age 75. Getting regular preventive care can help to keep you healthy and well. Preventive care includes getting regular testing and making lifestyle changes as recommended by your health care provider. Talk with your health care provider about:  Which screenings and tests you should have. A screening is a test that checks for a disease when you have no symptoms.  A diet and exercise plan that is right for you. What should I know about screenings and tests to prevent falls? Screening and testing are the best ways to find a health problem early. Early diagnosis and treatment give you the best chance of managing medical conditions that are common after age 55. Certain conditions and lifestyle choices may make you more likely to have a fall. Your health care provider may recommend:  Regular vision checks. Poor vision and conditions such as cataracts can make you more likely to have a fall. If you wear glasses, make sure to get your prescription updated if your vision changes.  Medicine review. Work with your health care provider to regularly review all of the medicines you are taking, including over-the-counter medicines. Ask your health care provider about any side effects that may make you more likely to have a fall. Tell your health care provider if any medicines that you take make you feel dizzy or sleepy.  Osteoporosis screening. Osteoporosis is a condition that causes the bones to get weaker. This can make the bones weak and cause them  to break more easily.  Blood pressure screening. Blood pressure changes and medicines to control blood pressure can make you feel dizzy.  Strength and balance checks. Your health care provider may recommend certain tests to check your strength and balance while standing, walking, or changing positions.  Foot health exam. Foot pain and numbness, as well as not wearing proper footwear, can make you more likely to have a fall.  Depression screening. You may be more likely to have a fall if you have a fear of falling, feel emotionally low, or feel unable to do activities that you used to do.  Alcohol use screening. Using too much alcohol can affect your balance and may make you more likely to have a fall. What actions can I take to lower my risk of falls? General instructions  Talk with your health care provider about your risks for falling. Tell your health care provider if: ? You fall. Be sure to tell your health care provider about all falls, even ones that seem minor. ? You feel dizzy, sleepy, or off-balance.  Take over-the-counter and prescription medicines only as told by your health care provider. These include any supplements.  Eat a healthy diet and maintain a healthy weight. A healthy diet includes low-fat dairy products, low-fat (lean) meats, and fiber from whole grains, beans, and lots of fruits and vegetables. Home safety  Remove any tripping hazards, such as rugs, cords, and clutter.  Install safety equipment such as grab bars in bathrooms and safety rails on  stairs.  Keep rooms and walkways well-lit. Activity   Follow a regular exercise program to stay fit. This will help you maintain your balance. Ask your health care provider what types of exercise are appropriate for you.  If you need a cane or walker, use it as recommended by your health care provider.  Wear supportive shoes that have nonskid soles. Lifestyle  Do not drink alcohol if your health care provider  tells you not to drink.  If you drink alcohol, limit how much you have: ? 0-1 drink a day for women. ? 0-2 drinks a day for men.  Be aware of how much alcohol is in your drink. In the [place] one drink equals one typical bottle of beer (12 oz), one-half glass of wine (5 oz), or one shot of hard liquor (1 oz).  Do not use any products that contain nicotine or tobacco, such as cigarettes and e-cigarettes. If you need help quitting, ask your health care provider. Summary  Having a healthy lifestyle and getting preventive care can help to protect your health and wellness after age 64.  Screening and testing are the best way to find a health problem early and help you avoid having a fall. Early diagnosis and treatment give you the best chance for managing medical conditions that are more common for people who are older than age 66.  Falls are a major cause of broken bones and head injuries in people who are older than age 61. Take precautions to prevent a fall at home.  Work with your health care provider to learn what changes you can make to improve your health and wellness and to prevent falls. This information is not intended to replace advice given to you by your health care provider. Make sure you discuss any questions you have with your health care provider. Document Released: 09/09/2017 Document Revised: 09/09/2017 Document Reviewed: 09/09/2017 Elsevier Interactive Patient Education  2019 [employer].

## 2018-12-15 NOTE — Assessment & Plan Note (Signed)
Preventative protocols reviewed and updated unless pt declined. Discussed healthy diet and lifestyle.  

## 2018-12-15 NOTE — Progress Notes (Signed)
BP 120/82 (BP Location: Left Arm, Patient Position: Sitting, Cuff Size: Normal)   Pulse 72   Temp 97.6 F (36.4 C) (Oral)   Ht 5\' 9"  (1.753 m)   Wt 193 lb 8 oz (87.8 kg)   SpO2 97%   BMI 28.57 kg/m    CC: CPE Subjective:    Patient ID: Edward Lester, male    DOB: 06-23-1946, 73 y.o.   MRN: [id number]  HPI: Edward Lester is a 73 y.o. male presenting on 12/15/2018 for Annual Exam (Wants to discuss colonoscopy.)   Does not have medicare.  Noticing increasing joint pains of hands - more noticeable when gripping golf clubs. Points to MCPs. No redness or warmth or swelling of joints. Has tried naprosyn (recently refilled) with benefit. Has had knee steroid injection with benefit in the past. Has been taking hyaluronic acid solution with benefit. Has had carpal tunnel release - told some hand arthritis.   Mild emphysema by CT - denies respiratory symptom.   Preventative: COLONOSCOPY Date: 2009 1 hyperplastic polyp [facility]).  [email or web address].Wants to rpt colonoscopy 05/2018. Will refer today. Prostate cancer screening - discussed ,would like to screen yearly. 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  Lung cancer screening - quit 2005. 30 PY history. Undergoing CT screening.  Flu shot - yearly Tdap 11/2016 Pneumovax - 11/28/2013, prevnar 05/2015 zostavax 2008 shingrix - discussed - will check at pharmacy  Advanced directive -scanned 11/2016 Delcie Roch (wife) then Thurnell Garbe (friend) are HCPOA. Jehova's witness. Ok with CPR and temporary life support but not prolonged if terminal condition. Ok with feeding tube.  Seat belt use discussed  Sunscreen use discussed, no changing moles on skin  Ex smoker - quit 2005  Alcohol - social  Dentist q6 mo  Eye examyearly   Lives with wife Grown children Jehova's witness Occupation: Printmaker rep - Education officer, museum Edu: college  Activity: golfing3x/wk Diet: good water, fruits/vegetables daily     Relevant past medical, surgical, family and  social history reviewed and updated as indicated. Interim medical history since our last visit reviewed. Allergies and medications reviewed and updated. Outpatient Medications Prior to Visit  Medication Sig Dispense Refill  . naproxen (NAPROSYN) 500 MG tablet TAKE 1 TABLET (500 MG TOTAL) BY MOUTH 2 (TWO) TIMES DAILY WITH A MEAL IF NEEDED 60 tablet 0  . simvastatin (ZOCOR) 20 MG tablet TAKE 1 TABLET BY MOUTH  DAILY AT 6 PM. 90 tablet 0   No facility-administered medications prior to visit.      Per HPI unless specifically indicated in ROS section below Review of Systems  Constitutional: Negative for activity change, appetite change, chills, fatigue, fever and unexpected weight change.  HENT: Negative for hearing loss.   Eyes: Negative for visual disturbance.       Dry eyes - treated with lubricating eye drops  Respiratory: Negative for cough, chest tightness, shortness of breath and wheezing.   Cardiovascular: Negative for chest pain, palpitations and leg swelling.  Gastrointestinal: Negative for abdominal distention, abdominal pain, blood in stool, constipation, diarrhea, nausea and vomiting.  Genitourinary: Negative for difficulty urinating and hematuria.  Musculoskeletal: Negative for arthralgias, myalgias and neck pain.  Skin: Negative for rash.  Neurological: Negative for dizziness, seizures, syncope and headaches.  Hematological: Negative for adenopathy. Does not bruise/bleed easily.  Psychiatric/Behavioral: Negative for dysphoric mood. The patient is not nervous/anxious.    Objective:    BP 120/82 (BP Location: Left Arm, Patient Position: Sitting, Cuff Size: Normal)   Pulse 72  Temp 97.6 F (36.4 C) (Oral)   Ht 5\' 9"  (1.753 m)   Wt 193 lb 8 oz (87.8 kg)   SpO2 97%   BMI 28.57 kg/m   Wt Readings from Last 3 Encounters:  12/15/18 193 lb 8 oz (87.8 kg)  06/21/18 195 lb 4 oz (88.6 kg)  06/10/18 190 lb (86.2 kg)    Physical Exam Vitals signs and nursing note reviewed.    Constitutional:      General: He is not in acute distress.    Appearance: Normal appearance. He is well-developed.  HENT:     Head: Normocephalic and atraumatic.     Right Ear: Hearing, tympanic membrane, ear canal and external ear normal.     Left Ear: Hearing, tympanic membrane, ear canal and external ear normal.     Nose: Nose normal.     Mouth/Throat:     Mouth: Mucous membranes are moist.     Pharynx: Uvula midline. No oropharyngeal exudate or posterior oropharyngeal erythema.  Eyes:     General: No scleral icterus.    Extraocular Movements: Extraocular movements intact.     Conjunctiva/sclera: Conjunctivae normal.     Pupils: Pupils are equal, round, and reactive to light.  Neck:     Musculoskeletal: Normal range of motion and neck supple.     Vascular: No carotid bruit.  Cardiovascular:     Rate and Rhythm: Normal rate and regular rhythm.     Pulses:          Radial pulses are 2+ on the right side and 2+ on the left side.     Heart sounds: Normal heart sounds. No murmur.  Pulmonary:     Effort: Pulmonary effort is normal. No respiratory distress.     Breath sounds: Normal breath sounds. No wheezing or rales.  Abdominal:     General: Bowel sounds are normal. There is no distension.     Palpations: Abdomen is soft. There is no mass.     Tenderness: There is no abdominal tenderness. There is no guarding or rebound.  Genitourinary:    Prostate: Enlarged (25gm). Not tender and no nodules present.     Rectum: Normal. No mass, tenderness, anal fissure, external hemorrhoid or internal hemorrhoid. Normal anal tone.  Musculoskeletal: Normal range of motion.  Lymphadenopathy:     Cervical: No cervical adenopathy.  Skin:    General: Skin is warm and dry.     Findings: No rash.  Neurological:     Mental Status: He is alert and oriented to person, place, and time.     Comments: CN grossly intact, station and gait intact  Psychiatric:        Behavior: Behavior normal.         Thought Content: Thought content normal.        Judgment: Judgment normal.       Results for orders placed or performed in visit on 12/14/18  Microalbumin / creatinine urine ratio  Result Value Ref Range   Microalb, Ur 1.7 0.0 - 1.9 mg/dL   Creatinine,U 167.4 mg/dL   Microalb Creat Ratio 1.0 0.0 - 30.0 mg/g  Hemoglobin A1c  Result Value Ref Range   Hgb A1c MFr Bld 5.5 4.6 - 6.5 %  PSA  Result Value Ref Range   PSA 0.99 0.10 - 4.00 ng/mL  Comprehensive metabolic panel  Result Value Ref Range   Sodium 139 135 - 145 mEq/L   Potassium 4.3 3.5 - 5.1 mEq/L   Chloride  106 96 - 112 mEq/L   CO2 26 19 - 32 mEq/L   Glucose, Bld 140 (H) 70 - 99 mg/dL   BUN 13 6 - 23 mg/dL   Creatinine, Ser 0.96 0.40 - 1.50 mg/dL   Total Bilirubin 0.6 0.2 - 1.2 mg/dL   Alkaline Phosphatase 66 39 - 117 U/L   AST 17 0 - 37 U/L   ALT 15 0 - 53 U/L   Total Protein 6.9 6.0 - 8.3 g/dL   Albumin 4.0 3.5 - 5.2 g/dL   Calcium 9.1 8.4 - 10.5 mg/dL   GFR 93.31 >60.00 mL/min  Lipid panel  Result Value Ref Range   Cholesterol 143 0 - 200 mg/dL   Triglycerides 84.0 0.0 - 149.0 mg/dL   HDL 40.90 >39.00 mg/dL   VLDL 16.8 0.0 - 40.0 mg/dL   LDL Cholesterol 85 0 - 99 mg/dL   Total CHOL/HDL Ratio 3    NonHDL 101.80    Assessment & Plan:   Problem List Items Addressed This Visit    Pain in both hands    Anticipate OA related. Denies synovitis symptoms. Continue to monitor with PRN naprosyn.       Relevant Orders   CBC with Differential/Platelet   HLD (hyperlipidemia)    Chronic, stable on simvastatin. The 10-year ASCVD risk score Mikey Bussing DC Brooke Bonito., et al., 2013) is: 19.1%   Values used to calculate the score:     Age: 8 years     Sex: Male     Is Non-Hispanic African American: Yes     Diabetic: Yes     Tobacco smoker: No     Systolic Blood Pressure: 338 mmHg     Is BP treated: No     HDL Cholesterol: 40.9 mg/dL     Total Cholesterol: 143 mg/dL       Health care maintenance - Primary    Preventative  protocols reviewed and updated unless pt declined. Discussed healthy diet and lifestyle.       Ex-smoker    Remains abstinent. Continues lung cancer screening.       Emphysema lung ([place])    By CT. Mild emphysema. Pt asxs.       Controlled diabetes mellitus with ophthalmic complication, without long-term current use of insulin (HCC)    Marked improvement over last 6 months - now normal range sugar control. Congratulated. Pt attributes to pumpkin seed oil. Will check fructosamine and CBC to verify accurate A1c reading. Consider resolving problem if remains well controlled. RTC 6 mo f/u visit per pt request.       Relevant Orders   Fructosamine   CAD (coronary artery disease)    By CT. Continue statin.       Aortic atherosclerosis ([place])    By CT. Continue statin.        Other Visit Diagnoses    Need for influenza vaccination       Relevant Orders   Flu Vaccine QUAD 36+ mos IM (Completed)   Special screening for malignant neoplasms, colon       Relevant Orders   Ambulatory referral to Gastroenterology       No orders of the defined types were placed in this encounter.  Orders Placed This Encounter  Procedures  . Flu Vaccine QUAD 36+ mos IM  . CBC with Differential/Platelet  . Fructosamine  . Ambulatory referral to Gastroenterology    Referral Priority:   Routine    Referral Type:   Consultation  Referral Reason:   Specialty Services Required    Number of Visits Requested:   1    Follow up plan: Return in about 6 months (around 06/15/2019) for follow up visit.  Ria Bush, MD

## 2018-12-15 NOTE — Assessment & Plan Note (Signed)
Chronic, stable on simvastatin. The 10-year ASCVD risk score Mikey Bussing DC Brooke Bonito., et al., 2013) is: 19.1%   Values used to calculate the score:     Age: 73 years     Sex: Male     Is Non-Hispanic African American: Yes     Diabetic: Yes     Tobacco smoker: No     Systolic Blood Pressure: 136 mmHg     Is BP treated: No     HDL Cholesterol: 40.9 mg/dL     Total Cholesterol: 143 mg/dL

## 2018-12-16 ENCOUNTER — Other Ambulatory Visit: Payer: Self-pay

## 2018-12-16 DIAGNOSIS — Z1211 Encounter for screening for malignant neoplasm of colon: Secondary | ICD-10-CM

## 2018-12-17 ENCOUNTER — Telehealth: Payer: Self-pay | Admitting: *Deleted

## 2018-12-17 NOTE — Telephone Encounter (Signed)
Received fax from [facility] requesting refill for Tadalafil.  Not on current medication list.  Please advise.

## 2018-12-18 LAB — FRUCTOSAMINE: Fructosamine: 305 umol/L — ABNORMAL HIGH (ref 205–285)

## 2018-12-19 NOTE — Telephone Encounter (Signed)
Would call and clarify with patient - he was previously on viagra (sildenafil) does he now want to try tadalafil? Would likely do 10-20mg  PRN. How many per 3 months will his insurance cover?

## 2018-12-20 NOTE — Telephone Encounter (Signed)
Left message on vm per dpr relaying Dr. Synthia Innocent message. Asked pt to call back with info for Dr. Darnell Level.

## 2018-12-21 NOTE — Telephone Encounter (Signed)
Left message on vm per dpr relaying Dr. Synthia Innocent message. Asked pt to call back with info for Dr. Darnell Level.

## 2018-12-22 NOTE — Telephone Encounter (Signed)
Left message on vm per dpr relaying Dr. Synthia Innocent message. Asked pt to call back with info for Dr. Darnell Level.  Mailing a letter.

## 2018-12-23 ENCOUNTER — Telehealth: Payer: Self-pay | Admitting: Family Medicine

## 2018-12-23 NOTE — Telephone Encounter (Signed)
Pt is stating he want to try the generic medication Tadalafil    Sent to [facility]    Pt call pt if you have any questions.

## 2018-12-24 MED ORDER — TADALAFIL 20 MG PO TABS: 10.0000 mg | ORAL_TABLET | ORAL | 3 refills | Status: DC | PRN

## 2018-12-24 NOTE — Telephone Encounter (Signed)
Patient notified as instructed by telephone and verbalized understanding. 

## 2018-12-24 NOTE — Telephone Encounter (Signed)
Sent in. Cannot take anything in nitroglycerin family for 2 days after he takes a pill. To seek immediate care if he has erection lasting longer than 4 hours or any chest pain with sex.

## 2019-01-04 ENCOUNTER — Ambulatory Visit: Payer: 59 | Admitting: Registered Nurse

## 2019-01-04 ENCOUNTER — Encounter: Payer: Self-pay | Admitting: Student

## 2019-01-04 ENCOUNTER — Other Ambulatory Visit: Payer: Self-pay

## 2019-01-04 ENCOUNTER — Encounter
Admission: RE | Disposition: A | Discharge status: Home / Self Care | Payer: Self-pay | Source: Ambulatory Visit | Attending: Gastroenterology

## 2019-01-04 ENCOUNTER — Ambulatory Visit
Admission: RE | Admit: 2019-01-04 | Discharge: 2019-01-04 | Disposition: A | Discharge status: Home / Self Care | Payer: 59 | Source: Ambulatory Visit | Attending: Gastroenterology | Admitting: Gastroenterology

## 2019-01-04 DIAGNOSIS — Z1211 Encounter for screening for malignant neoplasm of colon: Secondary | ICD-10-CM | POA: Insufficient documentation

## 2019-01-04 DIAGNOSIS — Z79899 Other long term (current) drug therapy: Secondary | ICD-10-CM | POA: Insufficient documentation

## 2019-01-04 DIAGNOSIS — E785 Hyperlipidemia, unspecified: Secondary | ICD-10-CM | POA: Insufficient documentation

## 2019-01-04 DIAGNOSIS — I700 Atherosclerosis of aorta: Secondary | ICD-10-CM | POA: Insufficient documentation

## 2019-01-04 DIAGNOSIS — N529 Male erectile dysfunction, unspecified: Secondary | ICD-10-CM | POA: Diagnosis not present

## 2019-01-04 DIAGNOSIS — D122 Benign neoplasm of ascending colon: Secondary | ICD-10-CM | POA: Diagnosis not present

## 2019-01-04 DIAGNOSIS — K5730 Diverticulosis of large intestine without perforation or abscess without bleeding: Secondary | ICD-10-CM | POA: Diagnosis not present

## 2019-01-04 DIAGNOSIS — K641 Second degree hemorrhoids: Secondary | ICD-10-CM | POA: Insufficient documentation

## 2019-01-04 DIAGNOSIS — J439 Emphysema, unspecified: Secondary | ICD-10-CM | POA: Diagnosis not present

## 2019-01-04 DIAGNOSIS — D125 Benign neoplasm of sigmoid colon: Secondary | ICD-10-CM | POA: Diagnosis not present

## 2019-01-04 DIAGNOSIS — K635 Polyp of colon: Secondary | ICD-10-CM | POA: Diagnosis not present

## 2019-01-04 DIAGNOSIS — Z87891 Personal history of nicotine dependence: Secondary | ICD-10-CM | POA: Diagnosis not present

## 2019-01-04 DIAGNOSIS — M1711 Unilateral primary osteoarthritis, right knee: Secondary | ICD-10-CM | POA: Insufficient documentation

## 2019-01-04 DIAGNOSIS — I2510 Atherosclerotic heart disease of native coronary artery without angina pectoris: Secondary | ICD-10-CM | POA: Insufficient documentation

## 2019-01-04 DIAGNOSIS — E119 Type 2 diabetes mellitus without complications: Secondary | ICD-10-CM | POA: Diagnosis not present

## 2019-01-04 HISTORY — PX: COLONOSCOPY WITH PROPOFOL: SHX5780

## 2019-01-04 SURGERY — COLONOSCOPY WITH PROPOFOL
Anesthesia: General

## 2019-01-04 MED ORDER — LIDOCAINE HCL (CARDIAC) PF 100 MG/5ML IV SOSY
PREFILLED_SYRINGE | INTRAVENOUS | Status: DC | PRN
  Administered 2019-01-04: 80 mg via INTRATRACHEAL

## 2019-01-04 MED ORDER — SODIUM CHLORIDE 0.9 % IV SOLN
INTRAVENOUS | Status: DC
  Administered 2019-01-04: 10:00:00 via INTRAVENOUS
  Administered 2019-01-04: 1000 mL via INTRAVENOUS

## 2019-01-04 MED ORDER — PROPOFOL 500 MG/50ML IV EMUL
INTRAVENOUS | Status: DC | PRN
  Administered 2019-01-04: 200 ug/kg/min via INTRAVENOUS

## 2019-01-04 MED ORDER — PROPOFOL 10 MG/ML IV BOLUS
INTRAVENOUS | Status: DC | PRN
  Administered 2019-01-04: 80 mg via INTRAVENOUS

## 2019-01-04 NOTE — Op Note (Signed)
[facility] Gastroenterology Patient Name: Edward Lester Procedure Date: 01/04/2019 9:54 AM MRN: [id number] Account #: [id number] Date of Birth: 1945-12-21 Admit Type: Outpatient Age: 73 Room: [facility] ENDO ROOM 4 Gender: Male Note Status: Finalized Procedure:            Colonoscopy Indications:          Screening for colorectal malignant neoplasm Providers:            [name] MD, MD Referring MD:         Ria Bush (Referring MD) Medicines:            Propofol per Anesthesia Complications:        No immediate complications. Procedure:            Pre-Anesthesia Assessment:                       - Prior to the procedure, a History and Physical was                        performed, and patient medications and allergies were                        reviewed. The patient's tolerance of previous                        anesthesia was also reviewed. The risks and benefits of                        the procedure and the sedation options and risks were                        discussed with the patient. All questions were                        answered, and informed consent was obtained. Prior                        Anticoagulants: The patient has taken no previous                        anticoagulant or antiplatelet agents. ASA Grade                        Assessment: II - A patient with mild systemic disease.                        After reviewing the risks and benefits, the patient was                        deemed in satisfactory condition to undergo the                        procedure.                       After obtaining informed consent, the colonoscope was                        passed under direct vision. Throughout the procedure,  the patient's blood pressure, pulse, and oxygen                        saturations were monitored continuously. The                        Colonoscope was introduced through the anus and     advanced to the the cecum, identified by appendiceal                        orifice and ileocecal valve. The colonoscopy was                        performed without difficulty. The patient tolerated the                        procedure well. The quality of the bowel preparation                        was excellent. Findings:      The perianal and digital rectal examinations were normal.      Two sessile polyps were found in the ascending colon. The polyps were 2       to 3 mm in size. These polyps were removed with a cold biopsy forceps.       Resection and retrieval were complete.      A 5 mm polyp was found in the sigmoid colon. The polyp was sessile. The       polyp was removed with a cold biopsy forceps. Resection and retrieval       were complete.      A few small-mouthed diverticula were found in the sigmoid colon.      Non-bleeding internal hemorrhoids were found during retroflexion. The       hemorrhoids were Grade II (internal hemorrhoids that prolapse but reduce       spontaneously). Impression:           - Two 2 to 3 mm polyps in the ascending colon, removed                        with a cold biopsy forceps. Resected and retrieved.                       - One 5 mm polyp in the sigmoid colon, removed with a                        cold biopsy forceps. Resected and retrieved.                       - Diverticulosis in the sigmoid colon.                       - Non-bleeding internal hemorrhoids. Recommendation:       - Discharge patient to home.                       - Resume previous diet.                       - Continue present medications.                       -  Await pathology results.                       - Repeat colonoscopy in 5 years if polyp adenoma and 10                        years if hyperplastic Procedure Code(s):    --- Professional ---                       [phone], Colonoscopy, flexible; with biopsy, single or                        multiple Diagnosis  Code(s):    --- Professional ---                       Z12.11, Encounter for screening for malignant neoplasm                        of colon                       D12.2, Benign neoplasm of ascending colon                       D12.5, Benign neoplasm of sigmoid colon CPT copyright 2018 American Medical Association. All rights reserved. The codes documented in this report are preliminary and upon coder review may  be revised to meet current compliance requirements. [name] MD, MD 01/04/2019 10:11:40 AM This report has been signed electronically. Number of Addenda: 0 Note Initiated On: 01/04/2019 9:54 AM Scope Withdrawal Time: 0 hours 7 minutes 25 seconds  Total Procedure Duration: 0 hours 10 minutes 7 seconds       [facility]

## 2019-01-04 NOTE — Anesthesia Preprocedure Evaluation (Signed)
Anesthesia Evaluation  Patient identified by MRN, date of birth, ID band Patient awake    Reviewed: Allergy & Precautions, H&P , NPO status , Patient's Chart, lab work & pertinent test results, reviewed documented beta blocker date and time   Airway Mallampati: II   Neck ROM: full    Dental  (+) Poor Dentition   Pulmonary neg pulmonary ROS, COPD, former smoker,    Pulmonary exam normal        Cardiovascular Exercise Tolerance: Poor + CAD  negative cardio ROS Normal cardiovascular exam Rhythm:regular Rate:Normal     Neuro/Psych  Neuromuscular disease negative neurological ROS  negative psych ROS   GI/Hepatic negative GI ROS, Neg liver ROS,   Endo/Other  negative endocrine ROSdiabetes, Well Controlled  Renal/GU negative Renal ROS  negative genitourinary   Musculoskeletal   Abdominal   Peds  Hematology negative hematology ROS (+)   Anesthesia Other Findings Past Medical History: 12/29/2016: Aortic atherosclerosis ([place])     Comment:  By CT 12/29/2016: CAD (coronary artery disease)     Comment:  By CT 11/11/2014: Diabetes mellitus type 2, controlled, without complications  ([place]) No date: ED (erectile dysfunction) of organic origin 12/29/2016: Emphysema lung ([place])     Comment:  Moderate changes of centrilobular and paraseptal               emphysema identified. Diffuse bronchial wall thickening               noted. By CT 12/2016 quit ~2005: Ex-smoker     Comment:  84 PY hx No date: History of colonic polyps No date: HLD (hyperlipidemia) 2015: Osteoarthritis of knee     Comment:  severe predominantly R No date: Refusal of blood transfusions as patient is Jehovah's Witness Past Surgical History: 2013: CARPAL TUNNEL RELEASE; Left 2009: COLONOSCOPY     Comment:  1 hyperplastic polyp [facility]) 2014: FOOT SURGERY; Left     Comment:  bone seperation on little toe 2005: LIPOMA EXCISION     Comment:  right  back BMI    Body Mass Index:  28.21 kg/m     Reproductive/Obstetrics negative OB ROS                             Anesthesia Physical Anesthesia Plan  ASA: III  Anesthesia Plan: General   Post-op Pain Management:    Induction:   PONV Risk Score and Plan:   Airway Management Planned:   Additional Equipment:   Intra-op Plan:   Post-operative Plan:   Informed Consent: I have reviewed the patients History and Physical, chart, labs and discussed the procedure including the risks, benefits and alternatives for the proposed anesthesia with the patient or authorized representative who has indicated his/her understanding and acceptance.     Dental Advisory Given  Plan Discussed with: CRNA  Anesthesia Plan Comments:         Anesthesia Quick Evaluation

## 2019-01-04 NOTE — Transfer of Care (Signed)
Immediate Anesthesia Transfer of Care Note  Patient: Edward Lester  Procedure(s) Performed: COLONOSCOPY WITH PROPOFOL (N/A )  Patient Location: PACU  Anesthesia Type:General  Level of Consciousness: Sedated  Airway & Oxygen Therapy: Patient Spontanous Breathing  Post-op Assessment: Report given to RN  Post vital signs: stable  Last Vitals:  Vitals Value Taken Time  BP 93/70 01/04/2019 10:13 AM  Temp 36.1 C 01/04/2019 10:11 AM  Pulse 63 01/04/2019 10:16 AM  Resp 13 01/04/2019 10:16 AM  SpO2 97 % 01/04/2019 10:16 AM  Vitals shown include unvalidated device data.  Last Pain:  Vitals:   01/04/19 1011  TempSrc: Tympanic  PainSc: Asleep         Complications: No apparent anesthesia complications

## 2019-01-04 NOTE — H&P (Signed)
Edward Lame, MD [facility] [address], [place], [place] [address] Phone: [phone] Fax : [phone]  Primary Care Physician:  Ria Bush, MD Primary Gastroenterologist:  Dr. [name]  Pre-Procedure History & Physical: HPI:  Edward Lester is a 73 y.o. male is here for a screening colonoscopy.   Past Medical History:  Diagnosis Date  . Aortic atherosclerosis ([place]) 12/29/2016   By CT  . CAD (coronary artery disease) 12/29/2016   By CT  . Diabetes mellitus type 2, controlled, without complications ([place]) 06/11/9936  . ED (erectile dysfunction) of organic origin   . Emphysema lung ([place]) 12/29/2016   Moderate changes of centrilobular and paraseptal emphysema identified. Diffuse bronchial wall thickening noted. By CT 12/2016  . Ex-smoker quit ~2005   30 PY hx  . History of colonic polyps   . HLD (hyperlipidemia)   . Osteoarthritis of knee 2015   severe predominantly R  . Refusal of blood transfusions as patient is Jehovah's Witness     Past Surgical History:  Procedure Laterality Date  . CARPAL TUNNEL RELEASE Left 2013  . COLONOSCOPY  2009   1 hyperplastic polyp [facility])  . FOOT SURGERY Left 2014   bone seperation on little toe  . LIPOMA EXCISION  2005   right back    Prior to Admission medications   Medication Sig Start Date End Date Taking? Authorizing Provider  naproxen (NAPROSYN) 500 MG tablet TAKE 1 TABLET (500 MG TOTAL) BY MOUTH 2 (TWO) TIMES DAILY WITH A MEAL IF NEEDED 11/23/18  Yes Ria Bush, MD  simvastatin (ZOCOR) 20 MG tablet TAKE 1 TABLET BY MOUTH  DAILY AT 6 PM. 11/17/18  Yes Ria Bush, MD  tadalafil (ADCIRCA/CIALIS) 20 MG tablet Take 0.5-1 tablets (10-20 mg total) by mouth every other day as needed for erectile dysfunction. 12/24/18  Yes Ria Bush, MD    Allergies as of 12/16/2018  . (No Known Allergies)    Family History  Problem Relation Age of Onset  . Lung cancer Mother 41       (smoker)  . Stomach cancer Father 90         (smoker)  . Thyroid disease Sister   . CAD Neg Hx   . Stroke Neg Hx   . Diabetes Neg Hx     Social History   Socioeconomic History  . Marital status: Married    Spouse name: Not on file  . Number of children: 4  . Years of education: Not on file  . Highest education level: Not on file  Occupational History  . Occupation: Biochemist, clinical  Social Needs  . Financial resource strain: Not on file  . Food insecurity:    Worry: Not on file    Inability: Not on file  . Transportation needs:    Medical: Not on file    Non-medical: Not on file  Tobacco Use  . Smoking status: Former Smoker    Packs/day: 1.00    Years: 30.00    Pack years: 30.00    Types: Cigarettes    Start date: 11/11/1967    Last attempt to quit: 11/11/2003    Years since quitting: 15.1  . Smokeless tobacco: Never Used  Substance and Sexual Activity  . Alcohol use: Yes    Comment: social  . Drug use: No  . Sexual activity: Not on file  Lifestyle  . Physical activity:    Days per week: Not on file    Minutes per session: Not on file  .  Stress: Not on file  Relationships  . Social connections:    Talks on phone: Not on file    Gets together: Not on file    Attends religious service: Not on file    Active member of club or organization: Not on file    Attends meetings of clubs or organizations: Not on file    Relationship status: Not on file  . Intimate partner violence:    Fear of current or ex partner: Not on file    Emotionally abused: Not on file    Physically abused: Not on file    Forced sexual activity: Not on file  Other Topics Concern  . Not on file  Social History Narrative   Lives with wife   Grown children   Jehova's witness   Occupation: Printmaker rep - Education officer, museum   Edu: college   Activity: golfing   Diet: good water, fruits/vegetables daily    Review of Systems: See HPI, otherwise negative ROS  Physical Exam: BP (!) 89/62   Pulse 67   Temp (!) 97 F (36.1  C) (Tympanic)   Resp 16   Ht 5\' 9"  (1.753 m)   Wt 86.6 kg   SpO2 98%   BMI 28.21 kg/m  General:   Alert,  pleasant and cooperative in NAD Head:  Normocephalic and atraumatic. Neck:  Supple; no masses or thyromegaly. Lungs:  Clear throughout to auscultation.    Heart:  Regular rate and rhythm. Abdomen:  Soft, nontender and nondistended. Normal bowel sounds, without guarding, and without rebound.   Neurologic:  Alert and  oriented x4;  grossly normal neurologically.  Impression/Plan: Edward Lester is now here to undergo a screening colonoscopy.  Risks, benefits, and alternatives regarding colonoscopy have been reviewed with the patient.  Questions have been answered.  All parties agreeable.

## 2019-01-04 NOTE — Anesthesia Post-op Follow-up Note (Signed)
Anesthesia QCDR form completed.        

## 2019-01-05 ENCOUNTER — Encounter: Payer: Self-pay | Admitting: Family Medicine

## 2019-01-05 ENCOUNTER — Telehealth: Payer: Self-pay | Admitting: *Deleted

## 2019-01-05 DIAGNOSIS — Z122 Encounter for screening for malignant neoplasm of respiratory organs: Secondary | ICD-10-CM

## 2019-01-05 DIAGNOSIS — Z87891 Personal history of nicotine dependence: Secondary | ICD-10-CM

## 2019-01-05 LAB — SURGICAL PATHOLOGY

## 2019-01-05 NOTE — Telephone Encounter (Signed)
Patient has been notified that annual lung cancer screening low dose CT scan is due currently or will be in near future. Confirmed that patient is within the age range of 55-77, and asymptomatic, (no signs or symptoms of lung cancer). Patient denies illness that would prevent curative treatment for lung cancer if found. Verified smoking history, (former, quit 2005, 30 pack year). The shared decision making visit was done 12/23/16. Patient is agreeable for CT scan being scheduled.

## 2019-01-06 ENCOUNTER — Encounter: Payer: Self-pay | Admitting: Gastroenterology

## 2019-01-13 ENCOUNTER — Encounter: Payer: Self-pay | Admitting: *Deleted

## 2019-01-15 ENCOUNTER — Encounter: Payer: Self-pay | Admitting: Family Medicine

## 2019-01-15 NOTE — Anesthesia Postprocedure Evaluation (Signed)
Anesthesia Post Note  Patient: Edward Lester  Procedure(s) Performed: COLONOSCOPY WITH PROPOFOL (N/A )  Patient location during evaluation: PACU Anesthesia Type: General Level of consciousness: awake and alert Pain management: pain level controlled Vital Signs Assessment: post-procedure vital signs reviewed and stable Respiratory status: spontaneous breathing, nonlabored ventilation, respiratory function stable and patient connected to nasal cannula oxygen Cardiovascular status: blood pressure returned to baseline and stable Postop Assessment: no apparent nausea or vomiting Anesthetic complications: no     Last Vitals:  Vitals:   01/04/19 1021 01/04/19 1031  BP: 115/73 128/78  Pulse: (!) 59 64  Resp: 13 (!) 23  Temp:    SpO2: 99% 99%    Last Pain:  Vitals:   01/05/19 0738  TempSrc:   PainSc: 0-No pain                 Molli Barrows

## 2019-01-26 ENCOUNTER — Ambulatory Visit: Admission: RE | Admit: 2019-01-26 | Payer: 59 | Source: Ambulatory Visit

## 2019-01-27 ENCOUNTER — Encounter: Payer: Self-pay | Admitting: *Deleted

## 2019-02-09 ENCOUNTER — Other Ambulatory Visit: Payer: Self-pay | Admitting: Family Medicine

## 2019-03-18 ENCOUNTER — Telehealth: Payer: Self-pay | Admitting: *Deleted

## 2019-03-18 NOTE — Telephone Encounter (Signed)
Contacted patient to reschedule lung screening scan. Given appt.

## 2019-03-23 ENCOUNTER — Ambulatory Visit: Admission: RE | Admit: 2019-03-23 | Payer: 59 | Source: Ambulatory Visit

## 2019-04-01 LAB — HM DIABETES EYE EXAM

## 2019-04-05 ENCOUNTER — Encounter: Payer: Self-pay | Admitting: Family Medicine

## 2019-04-29 ENCOUNTER — Telehealth: Payer: Self-pay

## 2019-04-29 NOTE — Telephone Encounter (Signed)
Call pt regarding lung screening. Left message for pt to return call.  

## 2019-07-20 ENCOUNTER — Telehealth: Payer: Self-pay | Admitting: *Deleted

## 2019-07-20 NOTE — Telephone Encounter (Signed)
Left message for patient to notify them that it is time to schedule annual low dose lung cancer screening CT scan. Instructed patient to call back to verify information prior to the scan being scheduled.  

## 2019-07-26 ENCOUNTER — Encounter: Payer: Self-pay | Admitting: *Deleted

## 2019-07-26 ENCOUNTER — Telehealth: Payer: Self-pay | Admitting: *Deleted

## 2019-07-26 DIAGNOSIS — Z122 Encounter for screening for malignant neoplasm of respiratory organs: Secondary | ICD-10-CM

## 2019-07-26 DIAGNOSIS — Z87891 Personal history of nicotine dependence: Secondary | ICD-10-CM

## 2019-07-26 NOTE — Telephone Encounter (Signed)
Patient has been notified that annual lung cancer screening low dose CT scan is due currently or will be in near future. Confirmed that patient is within the age range of 55-77, and asymptomatic, (no signs or symptoms of lung cancer). Patient denies illness that would prevent curative treatment for lung cancer if found. Verified smoking history, (former, quit 14 years ago, 30 pack year). The shared decision making visit was done 12/23/16. Patient is agreeable for CT scan being scheduled.

## 2019-08-10 ENCOUNTER — Ambulatory Visit: Admission: RE | Admit: 2019-08-10 | Payer: 59 | Source: Ambulatory Visit

## 2019-08-15 ENCOUNTER — Other Ambulatory Visit: Payer: Self-pay

## 2019-08-15 ENCOUNTER — Ambulatory Visit
Admission: RE | Admit: 2019-08-15 | Discharge: 2019-08-15 | Disposition: A | Discharge status: Home / Self Care | Payer: 59 | Source: Ambulatory Visit | Attending: Oncology | Admitting: Oncology

## 2019-08-15 DIAGNOSIS — Z122 Encounter for screening for malignant neoplasm of respiratory organs: Secondary | ICD-10-CM

## 2019-08-15 DIAGNOSIS — Z87891 Personal history of nicotine dependence: Secondary | ICD-10-CM | POA: Diagnosis present

## 2019-08-17 ENCOUNTER — Encounter: Payer: Self-pay | Admitting: *Deleted

## 2019-09-23 LAB — HEMOGLOBIN A1C: Hemoglobin A1C: 7.4

## 2019-09-23 LAB — LIPID PANEL
Cholesterol: 180 (ref 0–200)
HDL: 42 (ref 35–70)
LDL Cholesterol: 88
Triglycerides: 249 — AB (ref 40–160)

## 2019-10-26 ENCOUNTER — Telehealth: Payer: Self-pay | Admitting: Family Medicine

## 2019-10-26 ENCOUNTER — Encounter: Payer: Self-pay | Admitting: Family Medicine

## 2019-10-26 NOTE — Telephone Encounter (Signed)
Received labs from work - chol levels ok but A1c was back in diabetes range of 7.4%. He has upcoming appt 12/2019. Would offer specific OV for DM f/u sooner if he desires.

## 2019-10-26 NOTE — Telephone Encounter (Signed)
Left message on vm per dpr relaying Dr. Synthia Innocent message.  Asked pt to call back to schedule in office DM f/u.

## 2019-10-27 NOTE — Telephone Encounter (Signed)
Left message on vm per dpr relaying Dr. Synthia Innocent message.  Asked pt to call back to schedule in office DM f/u.

## 2019-10-28 NOTE — Telephone Encounter (Signed)
Schedule on 10/31/19 at 9:15.

## 2019-10-31 ENCOUNTER — Ambulatory Visit: Payer: 59 | Admitting: Family Medicine

## 2019-10-31 ENCOUNTER — Other Ambulatory Visit: Payer: Self-pay

## 2019-10-31 ENCOUNTER — Encounter: Payer: Self-pay | Admitting: Family Medicine

## 2019-10-31 VITALS — BP 120/78 | HR 78 | Temp 98.5°F | Ht 69.0 in | Wt 192.6 lb

## 2019-10-31 DIAGNOSIS — E1136 Type 2 diabetes mellitus with diabetic cataract: Secondary | ICD-10-CM | POA: Diagnosis not present

## 2019-10-31 DIAGNOSIS — E785 Hyperlipidemia, unspecified: Secondary | ICD-10-CM | POA: Diagnosis not present

## 2019-10-31 DIAGNOSIS — Z23 Encounter for immunization: Secondary | ICD-10-CM

## 2019-10-31 DIAGNOSIS — E1169 Type 2 diabetes mellitus with other specified complication: Secondary | ICD-10-CM

## 2019-10-31 DIAGNOSIS — Z125 Encounter for screening for malignant neoplasm of prostate: Secondary | ICD-10-CM | POA: Diagnosis not present

## 2019-10-31 NOTE — Progress Notes (Signed)
This visit was conducted in person.  BP 120/78 (BP Location: Left Arm, Patient Position: Sitting, Cuff Size: Large)   Pulse 78   Temp 98.5 F (36.9 C) (Temporal)   Ht 5\' 9"  (1.753 m)   Wt 192 lb 9 oz (87.3 kg)   SpO2 96%   BMI 28.44 kg/m    CC: DM f/u visit Subjective:    Patient ID: Edward Lester, male    DOB: 1945-11-19, 73 y.o.   MRN: [id number]  HPI: Edward Lester is a 73 y.o. male presenting on 10/31/2019 for Diabetes (Here for f/u.)   Recently received check up from insurance. Was suggested berberine and omega-3.   DM - does not regularly check sugars. Compliant with antihyperglycemic regimen which includes: diet control - some dietary indiscretions with candy/sweets recently. Denies low sugars or hypoglycemic symptoms. Denies paresthesias. Last diabetic eye exam 03/2019. Pneumovax: 2015. Prevnar: 2016. Glucometer brand: does not have. DSME: declines. Lab Results  Component Value Date   HGBA1C 7.4 09/23/2019   Diabetic Foot Exam - Simple   Simple Foot Form Diabetic Foot exam was performed with the following findings: Yes 10/31/2019  9:46 AM  Visual Inspection See comments: Yes Sensation Testing Intact to touch and monofilament testing bilaterally: Yes Pulse Check Posterior Tibialis and Dorsalis pulse intact bilaterally: Yes Comments Maceration between 4th/5th digits R>L    Lab Results  Component Value Date   MICROALBUR 1.7 12/14/2018       Relevant past medical, surgical, family and social history reviewed and updated as indicated. Interim medical history since our last visit reviewed. Allergies and medications reviewed and updated. Outpatient Medications Prior to Visit  Medication Sig Dispense Refill  . Barberry-Oreg Grape-Goldenseal (BERBERINE COMPLEX PO) Take 500 mg by mouth 2 (two) times daily.    . naproxen (NAPROSYN) 500 MG tablet TAKE 1 TABLET (500 MG TOTAL) BY MOUTH 2 (TWO) TIMES DAILY WITH A MEAL IF NEEDED (Patient taking differently: As needed) 60  tablet 0  . Omega-3 Fatty Acids (OMEGA 3 PO) Take 1 capsule by mouth 2 (two) times daily.    . simvastatin (ZOCOR) 20 MG tablet TAKE 1 TABLET BY MOUTH  DAILY AT 6 PM. 90 tablet 3  . tadalafil (ADCIRCA/CIALIS) 20 MG tablet Take 0.5-1 tablets (10-20 mg total) by mouth every other day as needed for erectile dysfunction. 15 tablet 3   No facility-administered medications prior to visit.     Per HPI unless specifically indicated in ROS section below Review of Systems Objective:    BP 120/78 (BP Location: Left Arm, Patient Position: Sitting, Cuff Size: Large)   Pulse 78   Temp 98.5 F (36.9 C) (Temporal)   Ht 5\' 9"  (1.753 m)   Wt 192 lb 9 oz (87.3 kg)   SpO2 96%   BMI 28.44 kg/m   Wt Readings from Last 3 Encounters:  10/31/19 192 lb 9 oz (87.3 kg)  08/15/19 191 lb (86.6 kg)  01/04/19 191 lb (86.6 kg)    Physical Exam Vitals and nursing note reviewed.  Constitutional:      General: He is not in acute distress.    Appearance: Normal appearance. He is well-developed. He is not ill-appearing.  HENT:     Head: Normocephalic and atraumatic.  Eyes:     General: No scleral icterus.    Extraocular Movements: Extraocular movements intact.     Conjunctiva/sclera: Conjunctivae normal.     Pupils: Pupils are equal, round, and reactive to light.  Cardiovascular:  Rate and Rhythm: Normal rate and regular rhythm.     Heart sounds: Normal heart sounds. No murmur.  Pulmonary:     Effort: Pulmonary effort is normal. No respiratory distress.     Breath sounds: Normal breath sounds. No wheezing or rales.  Musculoskeletal:     Cervical back: Normal range of motion and neck supple.     Right lower leg: No edema.     Left lower leg: No edema.     Comments: See HPI for foot exam if done  Lymphadenopathy:     Cervical: No cervical adenopathy.  Skin:    General: Skin is warm and dry.     Findings: No rash.  Neurological:     Mental Status: He is alert.  Psychiatric:        Mood and Affect:  Mood normal.        Behavior: Behavior normal.       Results for orders placed or performed in visit on 10/26/19  Lipid panel  Result Value Ref Range   Triglycerides 249 (A) 40 - 160   Cholesterol 180 0 - 200   HDL 42 35 - 70   LDL Cholesterol 88   Hemoglobin A1c  Result Value Ref Range   Hemoglobin A1C 7.4    Assessment & Plan:  This visit occurred during the SARS-CoV-2 public health emergency.  Safety protocols were in place, including screening questions prior to the visit, additional usage of staff PPE, and extensive cleaning of exam room while observing appropriate contact time as indicated for disinfecting solutions.   Problem List Items Addressed This Visit    Hyperlipidemia associated with type 2 diabetes mellitus ([place])   Relevant Orders   Lipid panel   Comprehensive metabolic panel   Controlled diabetes mellitus with ophthalmic complication, without long-term current use of insulin ([place]) - Primary    Recent A1c through work was 7.4% - will recheck when he returns fasting. Diabetic diet handout provided. Will refer to DSME classes. He agrees with plan. Check fructosamine as well as A1c given recent discrepancy between values.       Relevant Orders   Ambulatory referral to diabetic education   Hemoglobin A1c   Microalbumin / creatinine urine ratio   Fructosamine    Other Visit Diagnoses    Need for influenza vaccination       Relevant Orders   Flu Vaccine QUAD High Dose(Fluad) (Completed)   Special screening for malignant neoplasm of prostate       Relevant Orders   PSA       No orders of the defined types were placed in this encounter.  Orders Placed This Encounter  Procedures  . Flu Vaccine QUAD High Dose(Fluad)  . Lipid panel    Standing Status:   Future    Standing Expiration Date:   10/30/2020  . Comprehensive metabolic panel    Standing Status:   Future    Standing Expiration Date:   10/30/2020  . Hemoglobin A1c    Standing Status:   Future     Standing Expiration Date:   10/30/2020  . PSA    Standing Status:   Future    Standing Expiration Date:   10/30/2020  . Microalbumin / creatinine urine ratio    Standing Status:   Future    Standing Expiration Date:   10/30/2020  . Fructosamine    Standing Status:   Future    Standing Expiration Date:   10/30/2020  . Ambulatory  referral to diabetic education    Referral Priority:   Routine    Referral Type:   Consultation    Referral Reason:   Specialty Services Required    Number of Visits Requested:   1    Patient instructions: Flu shot today Return this week fasting for labs (Wednesday).  If sugar staying high, we may discuss starting metformin.  Check with insurance on preferred glucose meter brand and let me know.  Diabetic diet handout provided today.  We will refer you to diabetes classes.   Follow up plan: Return for annual exam, prior fasting for blood work.  Ria Bush, MD

## 2019-10-31 NOTE — Patient Instructions (Addendum)
Flu shot today Return this week fasting for labs (Wednesday).  If sugar staying high, we may discuss starting metformin.  Check with insurance on preferred glucose meter brand and let me know.  Diabetic diet handout provided today.  We will refer you to diabetes classes.   Diabetes Mellitus and Nutrition, Adult When you have diabetes (diabetes mellitus), it is very important to have healthy eating habits because your blood sugar (glucose) levels are greatly affected by what you eat and drink. Eating healthy foods in the appropriate amounts, at about the same times every day, can help you:  Control your blood glucose.  Lower your risk of heart disease.  Improve your blood pressure.  Reach or maintain a healthy weight. Every person with diabetes is different, and each person has different needs for a meal plan. Your health care provider may recommend that you work with a diet and nutrition specialist (dietitian) to make a meal plan that is best for you. Your meal plan may vary depending on factors such as:  The calories you need.  The medicines you take.  Your weight.  Your blood glucose, blood pressure, and cholesterol levels.  Your activity level.  Other health conditions you have, such as heart or kidney disease. How do carbohydrates affect me? Carbohydrates, also called carbs, affect your blood glucose level more than any other type of food. Eating carbs naturally raises the amount of glucose in your blood. Carb counting is a method for keeping track of how many carbs you eat. Counting carbs is important to keep your blood glucose at a healthy level, especially if you use insulin or take certain oral diabetes medicines. It is important to know how many carbs you can safely have in each meal. This is different for every person. Your dietitian can help you calculate how many carbs you should have at each meal and for each snack. Foods that contain carbs include:  Bread, cereal,  rice, pasta, and crackers.  Potatoes and corn.  Peas, beans, and lentils.  Milk and yogurt.  Fruit and juice.  Desserts, such as cakes, cookies, ice cream, and candy. How does alcohol affect me? Alcohol can cause a sudden decrease in blood glucose (hypoglycemia), especially if you use insulin or take certain oral diabetes medicines. Hypoglycemia can be a life-threatening condition. Symptoms of hypoglycemia (sleepiness, dizziness, and confusion) are similar to symptoms of having too much alcohol. If your health care provider says that alcohol is safe for you, follow these guidelines:  Limit alcohol intake to no more than 1 drink per day for nonpregnant women and 2 drinks per day for men. One drink equals 12 oz of beer, 5 oz of wine, or 1 oz of hard liquor.  Do not drink on an empty stomach.  Keep yourself hydrated with water, diet soda, or unsweetened iced tea.  Keep in mind that regular soda, juice, and other mixers may contain a lot of sugar and must be counted as carbs. What are tips for following this plan?  Reading food labels  Start by checking the serving size on the "Nutrition Facts" label of packaged foods and drinks. The amount of calories, carbs, fats, and other nutrients listed on the label is based on one serving of the item. Many items contain more than one serving per package.  Check the total grams (g) of carbs in one serving. You can calculate the number of servings of carbs in one serving by dividing the total carbs by 15. For example,  if a food has 30 g of total carbs, it would be equal to 2 servings of carbs.  Check the number of grams (g) of saturated and trans fats in one serving. Choose foods that have low or no amount of these fats.  Check the number of milligrams (mg) of salt (sodium) in one serving. Most people should limit total sodium intake to less than 2,300 mg per day.  Always check the nutrition information of foods labeled as "low-fat" or "nonfat".  These foods may be higher in added sugar or refined carbs and should be avoided.  Talk to your dietitian to identify your daily goals for nutrients listed on the label. Shopping  Avoid buying canned, premade, or processed foods. These foods tend to be high in fat, sodium, and added sugar.  Shop around the outside edge of the grocery store. This includes fresh fruits and vegetables, bulk grains, fresh meats, and fresh dairy. Cooking  Use low-heat cooking methods, such as baking, instead of high-heat cooking methods like deep frying.  Cook using healthy oils, such as olive, canola, or sunflower oil.  Avoid cooking with butter, cream, or high-fat meats. Meal planning  Eat meals and snacks regularly, preferably at the same times every day. Avoid going long periods of time without eating.  Eat foods high in fiber, such as fresh fruits, vegetables, beans, and whole grains. Talk to your dietitian about how many servings of carbs you can eat at each meal.  Eat 4-6 ounces (oz) of lean protein each day, such as lean meat, chicken, fish, eggs, or tofu. One oz of lean protein is equal to: ? 1 oz of meat, chicken, or fish. ? 1 egg. ?  cup of tofu.  Eat some foods each day that contain healthy fats, such as avocado, nuts, seeds, and fish. Lifestyle  Check your blood glucose regularly.  Exercise regularly as told by your health care provider. This may include: ? 150 minutes of moderate-intensity or vigorous-intensity exercise each week. This could be brisk walking, biking, or water aerobics. ? Stretching and doing strength exercises, such as yoga or weightlifting, at least 2 times a week.  Take medicines as told by your health care provider.  Do not use any products that contain nicotine or tobacco, such as cigarettes and e-cigarettes. If you need help quitting, ask your health care provider.  Work with a Social worker or diabetes educator to identify strategies to manage stress and any  emotional and social challenges. Questions to ask a health care provider  Do I need to meet with a diabetes educator?  Do I need to meet with a dietitian?  What number can I call if I have questions?  When are the best times to check my blood glucose? Where to find more information:  American Diabetes Association: diabetes.org  Academy of Nutrition and Dietetics: www.eatright.[employer] of Diabetes and Digestive and Kidney Diseases (NIH): [employer] Summary  A healthy meal plan will help you control your blood glucose and maintain a healthy lifestyle.  Working with a diet and nutrition specialist (dietitian) can help you make a meal plan that is best for you.  Keep in mind that carbohydrates (carbs) and alcohol have immediate effects on your blood glucose levels. It is important to count carbs and to use alcohol carefully. This information is not intended to replace advice given to you by your health care provider. Make sure you discuss any questions you have with your health care provider. Document Released: 07/24/2005  Document Revised: 10/09/2017 Document Reviewed: 12/01/2016 Elsevier Patient Education  2020 [employer].

## 2019-10-31 NOTE — Assessment & Plan Note (Signed)
Recent A1c through work was 7.4% - will recheck when he returns fasting. Diabetic diet handout provided. Will refer to DSME classes. He agrees with plan. Check fructosamine as well as A1c given recent discrepancy between values.

## 2019-11-02 ENCOUNTER — Other Ambulatory Visit (INDEPENDENT_AMBULATORY_CARE_PROVIDER_SITE_OTHER): Payer: 59

## 2019-11-02 ENCOUNTER — Other Ambulatory Visit: Payer: Self-pay

## 2019-11-02 DIAGNOSIS — E1169 Type 2 diabetes mellitus with other specified complication: Secondary | ICD-10-CM

## 2019-11-02 DIAGNOSIS — Z125 Encounter for screening for malignant neoplasm of prostate: Secondary | ICD-10-CM

## 2019-11-02 DIAGNOSIS — E785 Hyperlipidemia, unspecified: Secondary | ICD-10-CM

## 2019-11-02 DIAGNOSIS — E1136 Type 2 diabetes mellitus with diabetic cataract: Secondary | ICD-10-CM

## 2019-11-02 LAB — COMPREHENSIVE METABOLIC PANEL
ALT: 13 U/L (ref 0–53)
AST: 16 U/L (ref 0–37)
Albumin: 4.2 g/dL (ref 3.5–5.2)
Alkaline Phosphatase: 68 U/L (ref 39–117)
BUN: 17 mg/dL (ref 6–23)
CO2: 23 mEq/L (ref 19–32)
Calcium: 8.9 mg/dL (ref 8.4–10.5)
Chloride: 106 mEq/L (ref 96–112)
Creatinine, Ser: 0.94 mg/dL (ref 0.40–1.50)
GFR: 95.37 mL/min (ref >60.00)
Glucose, Bld: 122 mg/dL — ABNORMAL HIGH (ref 70–99)
Potassium: 4.4 mEq/L (ref 3.5–5.1)
Sodium: 139 mEq/L (ref 135–145)
Total Bilirubin: 0.4 mg/dL (ref 0.2–1.2)
Total Protein: 7.1 g/dL (ref 6.0–8.3)

## 2019-11-02 LAB — MICROALBUMIN / CREATININE URINE RATIO
Creatinine,U: 95.4 mg/dL
Microalb Creat Ratio: 0.8 mg/g (ref 0.0–30.0)
Microalb, Ur: 0.7 mg/dL (ref 0.0–1.9)

## 2019-11-02 LAB — HEMOGLOBIN A1C: Hgb A1c MFr Bld: 6.8 % — ABNORMAL HIGH (ref 4.6–6.5)

## 2019-11-02 LAB — LIPID PANEL
Cholesterol: 137 mg/dL (ref 0–200)
HDL: 39.5 mg/dL (ref >39.00)
LDL Cholesterol: 84 mg/dL (ref 0–99)
NonHDL: 97.98
Total CHOL/HDL Ratio: 3
Triglycerides: 68 mg/dL (ref 0.0–149.0)
VLDL: 13.6 mg/dL (ref 0.0–40.0)

## 2019-11-02 LAB — PSA: PSA: 0.83 ng/mL (ref 0.10–4.00)

## 2019-11-10 LAB — FRUCTOSAMINE: Fructosamine: 295 umol/L — ABNORMAL HIGH (ref 205–285)

## 2019-12-07 ENCOUNTER — Telehealth: Payer: Self-pay

## 2019-12-07 NOTE — Telephone Encounter (Signed)
Patient came in stating that he received an email from insurance company that he is trying to receive benefits from, but was turned down due to the health conditions that was listed to [employer]. He states he was unaware of any of the health problems that was provided in the email. He would like it if Dr or nurse would call him back ASAP to address this, so it can be fixed    Please call and advise

## 2019-12-08 NOTE — Telephone Encounter (Signed)
Left message on vm per dpr asking pt to call back letting us know what health conditions he had questions/concerns about.

## 2019-12-09 NOTE — Telephone Encounter (Signed)
Spoke with patient.

## 2019-12-09 NOTE — Telephone Encounter (Addendum)
Spoke with pt about and email he received about health conditions.  Says he will drop off a copy of the email for Dr. Darnell Level to look at so it can be explained.  He is very anxious about the conditions mentioned stating he's never been told about them.   Wants to know if he has serious health conditions he has not been told about and why not.

## 2019-12-09 NOTE — Telephone Encounter (Addendum)
Placed copy of email in Dr. Synthia Innocent box. [see my msg below.]

## 2019-12-19 ENCOUNTER — Other Ambulatory Visit: Payer: Self-pay

## 2019-12-19 ENCOUNTER — Ambulatory Visit (INDEPENDENT_AMBULATORY_CARE_PROVIDER_SITE_OTHER): Payer: 59 | Admitting: Family Medicine

## 2019-12-19 ENCOUNTER — Encounter: Payer: Self-pay | Admitting: Family Medicine

## 2019-12-19 VITALS — BP 122/70 | HR 67 | Temp 98.1°F | Ht 69.5 in | Wt 189.3 lb

## 2019-12-19 DIAGNOSIS — M1711 Unilateral primary osteoarthritis, right knee: Secondary | ICD-10-CM

## 2019-12-19 DIAGNOSIS — Z0000 Encounter for general adult medical examination without abnormal findings: Secondary | ICD-10-CM

## 2019-12-19 DIAGNOSIS — E1136 Type 2 diabetes mellitus with diabetic cataract: Secondary | ICD-10-CM | POA: Diagnosis not present

## 2019-12-19 DIAGNOSIS — I700 Atherosclerosis of aorta: Secondary | ICD-10-CM

## 2019-12-19 DIAGNOSIS — E785 Hyperlipidemia, unspecified: Secondary | ICD-10-CM

## 2019-12-19 DIAGNOSIS — I2584 Coronary atherosclerosis due to calcified coronary lesion: Secondary | ICD-10-CM

## 2019-12-19 DIAGNOSIS — E1169 Type 2 diabetes mellitus with other specified complication: Secondary | ICD-10-CM | POA: Diagnosis not present

## 2019-12-19 DIAGNOSIS — Z87891 Personal history of nicotine dependence: Secondary | ICD-10-CM

## 2019-12-19 DIAGNOSIS — I2510 Atherosclerotic heart disease of native coronary artery without angina pectoris: Secondary | ICD-10-CM

## 2019-12-19 DIAGNOSIS — J432 Centrilobular emphysema: Secondary | ICD-10-CM

## 2019-12-19 MED ORDER — NAPROXEN 500 MG PO TABS: ORAL_TABLET | ORAL | 0 refills | Status: DC

## 2019-12-19 MED ORDER — TRAMADOL HCL 50 MG PO TABS: 50.0000 mg | ORAL_TABLET | Freq: Four times a day (QID) | ORAL | 0 refills | Status: AC | PRN

## 2019-12-19 MED ORDER — ATORVASTATIN CALCIUM 40 MG PO TABS: 40.0000 mg | ORAL_TABLET | Freq: Every day | ORAL | 3 refills | Status: DC

## 2019-12-19 NOTE — Assessment & Plan Note (Signed)
On statin.

## 2019-12-19 NOTE — Assessment & Plan Note (Signed)
Chronic, diet controlled diabetes, A1c at goal off medication. Continue this.

## 2019-12-19 NOTE — Assessment & Plan Note (Signed)
Continues lung cancer screening CT.

## 2019-12-19 NOTE — Assessment & Plan Note (Signed)
Chronic, stable on simvastatin and fish oil - given mild ATH on lung cancer screening CT, reasonable to try more potent statin - atorvastatin 40mg  sent to pharmacy. The 10-year ASCVD risk score Mikey Bussing DC Brooke Bonito., et al., 2013) is: 20.2%   Values used to calculate the score:     Age: 73 years     Sex: Male     Is Non-Hispanic African American: Yes     Diabetic: Yes     Tobacco smoker: No     Systolic Blood Pressure: 123XX123 mmHg     Is BP treated: No     HDL Cholesterol: 39.5 mg/dL     Total Cholesterol: 137 mg/dL

## 2019-12-19 NOTE — Assessment & Plan Note (Addendum)
S/p synvisc Considering Regenix stem cell treatment

## 2019-12-19 NOTE — Progress Notes (Signed)
This visit was conducted in person.  BP 122/70 (BP Location: Left Arm, Patient Position: Sitting, Cuff Size: Normal)   Pulse 67   Temp 98.1 F (36.7 C) (Temporal)   Ht 5' 9.5" (1.765 m)   Wt 189 lb 5 oz (85.9 kg)   SpO2 95%   BMI 27.56 kg/m    CC: CPE Subjective:    Patient ID: Edward Lester, male    DOB: Jun 04, 1946, 74 y.o.   MRN: [id number]  HPI: Edward Lester is a 74 y.o. male presenting on 12/19/2019 for Annual Exam   Does not have medicare.  Saw Dr Lorelei Pont for R knee osteoarthritis - considering Regenix stem cell treatment in [place]. Doing well after synvisc injections.   Preventative: COLONOSCOPY WITH PROPOFOL 01/04/2019 - TAx2, HPx1, diverticulosis, rpt 5 yrs [name], [name], MD) Prostate cancer screening - discussed ,would like to screen yearly. 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  Lung cancer screening - quit 2005. 30 PY history. Undergoing CT screening, started 2018.  Flu shot - yearly  Tdap 11/2016 Pneumovax - 11/28/2013, prevnar 05/2015 zostavax 2008 shingrix - completed 2020 covid - discussed. Considering. Signed up for waiting list.  Advanced directive -scanned1/2018- Delcie Roch (wife) then Thurnell Garbe (friend) are HCPOA. Jehova's witness.Ok with CPR and temporary life support but not prolonged if terminal condition. Ok with feeding tube. Seat belt use discussed  Sunscreen use discussed, no changing moles on skin  Ex smoker - quit 2005  Alcohol - social  Dentist q6 mo - recent crown now with toothache - planning to return to dentist  Eye examyearly - monitoring cataract   Lives with wife Grown children Jehova's witness Occupation: Printmaker rep - Education officer, [employer]: college  Activity: golfing3x/wk Diet: good water, fruits/vegetables daily- has limited sugared cereal      Relevant past medical, surgical, family and social history reviewed and updated as indicated. Interim medical history since our last visit reviewed. Allergies and medications reviewed and  updated. Outpatient Medications Prior to Visit  Medication Sig Dispense Refill  . Omega-3 Fatty Acids (OMEGA 3 PO) Take 1 capsule by mouth 2 (two) times daily.    . simvastatin (ZOCOR) 20 MG tablet TAKE 1 TABLET BY MOUTH  DAILY AT 6 PM. 90 tablet 3  . naproxen (NAPROSYN) 500 MG tablet TAKE 1 TABLET (500 MG TOTAL) BY MOUTH 2 (TWO) TIMES DAILY WITH A MEAL IF NEEDED (Patient taking differently: As needed) 60 tablet 0  . Barberry-Oreg Grape-Goldenseal (BERBERINE COMPLEX PO) Take 500 mg by mouth 2 (two) times daily.     No facility-administered medications prior to visit.     Per HPI unless specifically indicated in ROS section below Review of Systems  Constitutional: Negative for activity change, appetite change, chills, fatigue, fever and unexpected weight change.  HENT: Negative for hearing loss.   Eyes: Negative for visual disturbance.  Respiratory: Negative for cough, chest tightness, shortness of breath and wheezing.   Cardiovascular: Negative for chest pain, palpitations and leg swelling.  Gastrointestinal: Negative for abdominal distention, abdominal pain, blood in stool, constipation, diarrhea, nausea and vomiting.  Genitourinary: Negative for difficulty urinating and hematuria.  Musculoskeletal: Negative for arthralgias, myalgias and neck pain.  Skin: Negative for rash.  Neurological: Negative for dizziness, seizures, syncope and headaches.  Hematological: Negative for adenopathy. Does not bruise/bleed easily.  Psychiatric/Behavioral: Negative for dysphoric mood. The patient is not nervous/anxious.    Objective:    BP 122/70 (BP Location: Left Arm, Patient Position: Sitting, Cuff Size: Normal)   Pulse  67   Temp 98.1 F (36.7 C) (Temporal)   Ht 5' 9.5" (1.765 m)   Wt 189 lb 5 oz (85.9 kg)   SpO2 95%   BMI 27.56 kg/m   Wt Readings from Last 3 Encounters:  12/19/19 189 lb 5 oz (85.9 kg)  10/31/19 192 lb 9 oz (87.3 kg)  08/15/19 191 lb (86.6 kg)    Physical Exam Vitals and  nursing note reviewed.  Constitutional:      General: He is not in acute distress.    Appearance: Normal appearance. He is well-developed. He is not ill-appearing.  HENT:     Head: Normocephalic and atraumatic.     Right Ear: Hearing, tympanic membrane, ear canal and external ear normal.     Left Ear: Hearing, tympanic membrane, ear canal and external ear normal.     Mouth/Throat:     Pharynx: Uvula midline.  Eyes:     General: No scleral icterus.    Extraocular Movements: Extraocular movements intact.     Conjunctiva/sclera: Conjunctivae normal.     Pupils: Pupils are equal, round, and reactive to light.  Neck:     Thyroid: No thyromegaly or thyroid tenderness.     Vascular: No carotid bruit.  Cardiovascular:     Rate and Rhythm: Normal rate and regular rhythm.     Pulses: Normal pulses.          Radial pulses are 2+ on the right side and 2+ on the left side.     Heart sounds: Normal heart sounds. No murmur.  Pulmonary:     Effort: Pulmonary effort is normal. No respiratory distress.     Breath sounds: Normal breath sounds. No wheezing, rhonchi or rales.  Abdominal:     General: Abdomen is flat. Bowel sounds are normal. There is no distension.     Palpations: Abdomen is soft. There is no mass.     Tenderness: There is no abdominal tenderness. There is no guarding or rebound.     Hernia: No hernia is present.  Genitourinary:    Prostate: Normal. Not enlarged (20gm), not tender and no nodules present.     Rectum: Normal. No mass, tenderness, anal fissure, external hemorrhoid or internal hemorrhoid. Normal anal tone.  Musculoskeletal:        General: Normal range of motion.     Cervical back: Normal range of motion and neck supple.     Right lower leg: No edema.     Left lower leg: No edema.  Lymphadenopathy:     Cervical: No cervical adenopathy.  Skin:    General: Skin is warm and dry.     Findings: No rash.  Neurological:     General: No focal deficit present.     Mental  Status: He is alert and oriented to person, place, and time.     Comments: CN grossly intact, station and gait intact  Psychiatric:        Mood and Affect: Mood normal.        Behavior: Behavior normal.        Thought Content: Thought content normal.        Judgment: Judgment normal.       Results for orders placed or performed in visit on 11/02/19  Fructosamine  Result Value Ref Range   Fructosamine 295 (H) 205 - 285 umol/L  Microalbumin / creatinine urine ratio  Result Value Ref Range   Microalb, Ur 0.7 0.0 - 1.9 mg/dL   Creatinine,U 95.4  mg/dL   Microalb Creat Ratio 0.8 0.0 - 30.0 mg/g  PSA  Result Value Ref Range   PSA 0.83 0.10 - 4.00 ng/mL  Hemoglobin A1c  Result Value Ref Range   Hgb A1c MFr Bld 6.8 (H) 4.6 - 6.5 %  Comprehensive metabolic panel  Result Value Ref Range   Sodium 139 135 - 145 mEq/L   Potassium 4.4 3.5 - 5.1 mEq/L   Chloride 106 96 - 112 mEq/L   CO2 23 19 - 32 mEq/L   Glucose, Bld 122 (H) 70 - 99 mg/dL   BUN 17 6 - 23 mg/dL   Creatinine, Ser 0.94 0.40 - 1.50 mg/dL   Total Bilirubin 0.4 0.2 - 1.2 mg/dL   Alkaline Phosphatase 68 39 - 117 U/L   AST 16 0 - 37 U/L   ALT 13 0 - 53 U/L   Total Protein 7.1 6.0 - 8.3 g/dL   Albumin 4.2 3.5 - 5.2 g/dL   GFR 95.37 >60.00 mL/min   Calcium 8.9 8.4 - 10.5 mg/dL  Lipid panel  Result Value Ref Range   Cholesterol 137 0 - 200 mg/dL   Triglycerides 68.0 0.0 - 149.0 mg/dL   HDL 39.50 >39.00 mg/dL   VLDL 13.6 0.0 - 40.0 mg/dL   LDL Cholesterol 84 0 - 99 mg/dL   Total CHOL/HDL Ratio 3    NonHDL 97.98   Fructosamine equivalent to A1c of ~7.3%  Assessment & Plan:  This visit occurred during the SARS-CoV-2 public health emergency.  Safety protocols were in place, including screening questions prior to the visit, additional usage of staff PPE, and extensive cleaning of exam room while observing appropriate contact time as indicated for disinfecting solutions.   Problem List Items Addressed This Visit     Osteoarthritis of right knee    S/p synvisc Considering Regenix stem cell treatment      Relevant Medications   naproxen (NAPROSYN) 500 MG tablet   traMADol (ULTRAM) 50 MG tablet   Hyperlipidemia associated with type 2 diabetes mellitus (HCC)    Chronic, stable on simvastatin and fish oil - given mild ATH on lung cancer screening CT, reasonable to try more potent statin - atorvastatin 40mg  sent to pharmacy. The 10-year ASCVD risk score Mikey Bussing DC Brooke Bonito., et al., 2013) is: 20.2%   Values used to calculate the score:     Age: 29 years     Sex: Male     Is Non-Hispanic African American: Yes     Diabetic: Yes     Tobacco smoker: No     Systolic Blood Pressure: 123XX123 mmHg     Is BP treated: No     HDL Cholesterol: 39.5 mg/dL     Total Cholesterol: 137 mg/dL       Relevant Medications   atorvastatin (LIPITOR) 40 MG tablet   Health care maintenance - Primary    Preventative protocols reviewed and updated unless pt declined. Discussed healthy diet and lifestyle.       Ex-smoker    Continues lung cancer screening CT.       Emphysema lung (HCC)    Asymptomatic.       Coronary artery calcification    Reviewed latest lung cancer screening CT with patient. Overall asxs. Will increase potency of statin to atorvastatin. He will try to obtain EKG from last year - if not we will check next visit.       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 Continue this.       Relevant Medications   atorvastatin (LIPITOR) 40 MG tablet   Aortic atherosclerosis (HCC)    On statin.       Relevant Medications   atorvastatin (LIPITOR) 40 MG tablet       Meds ordered this encounter  Medications  . naproxen (NAPROSYN) 500 MG tablet    Sig: TAKE 1 TABLET (500 MG TOTAL) BY MOUTH 2 (TWO) TIMES DAILY WITH A MEAL IF NEEDED    Dispense:  40 tablet    Refill:  0    . traMADol (ULTRAM) 50 MG tablet    Sig: Take 1 tablet (50 mg total) by mouth every 6 (six) hours as needed for up to 5 days.    Dispense:  20 tablet    Refill:  0  . atorvastatin (LIPITOR) 40 MG tablet    Sig: Take 1 tablet (40 mg total) by mouth daily.    Dispense:  90 tablet    Refill:  3    To replace simvastatin   No orders of the defined types were placed in this encounter.   Follow up plan: Return in about 3 months (around 03/17/2020) for follow up visit.  Ria Bush, MD

## 2019-12-19 NOTE — Assessment & Plan Note (Signed)
Preventative protocols reviewed and updated unless pt declined. Discussed healthy diet and lifestyle.  

## 2019-12-19 NOTE — Assessment & Plan Note (Signed)
Asymptomatic. 

## 2019-12-19 NOTE — Assessment & Plan Note (Signed)
Reviewed latest lung cancer screening CT with patient. Overall asxs. Will increase potency of statin to atorvastatin. He will try to obtain EKG from last year - if not we will check next visit.

## 2019-12-19 NOTE — Patient Instructions (Addendum)
You are doing well today  Bring me copy of latest EKG - if unable to, we will check EKG next visit.  Change simvastatin to atorvastatin 40mg  daily - more potent cholesterol medicine.  Return in 3 months for diabetes follow up visit.   Health Maintenance After Age 73 After age 107, you are at a higher risk for certain long-term diseases and infections as well as injuries from falls. Falls are a major cause of broken bones and head injuries in people who are older than age 12. Getting regular preventive care can help to keep you healthy and well. Preventive care includes getting regular testing and making lifestyle changes as recommended by your health care provider. Talk with your health care provider about:  Which screenings and tests you should have. A screening is a test that checks for a disease when you have no symptoms.  A diet and exercise plan that is right for you. What should I know about screenings and tests to prevent falls? Screening and testing are the best ways to find a health problem early. Early diagnosis and treatment give you the best chance of managing medical conditions that are common after age 30. Certain conditions and lifestyle choices may make you more likely to have a fall. Your health care provider may recommend:  Regular vision checks. Poor vision and conditions such as cataracts can make you more likely to have a fall. If you wear glasses, make sure to get your prescription updated if your vision changes.  Medicine review. Work with your health care provider to regularly review all of the medicines you are taking, including over-the-counter medicines. Ask your health care provider about any side effects that may make you more likely to have a fall. Tell your health care provider if any medicines that you take make you feel dizzy or sleepy.  Osteoporosis screening. Osteoporosis is a condition that causes the bones to get weaker. This can make the bones weak and cause  them to break more easily.  Blood pressure screening. Blood pressure changes and medicines to control blood pressure can make you feel dizzy.  Strength and balance checks. Your health care provider may recommend certain tests to check your strength and balance while standing, walking, or changing positions.  Foot health exam. Foot pain and numbness, as well as not wearing proper footwear, can make you more likely to have a fall.  Depression screening. You may be more likely to have a fall if you have a fear of falling, feel emotionally low, or feel unable to do activities that you used to do.  Alcohol use screening. Using too much alcohol can affect your balance and may make you more likely to have a fall. What actions can I take to lower my risk of falls? General instructions  Talk with your health care provider about your risks for falling. Tell your health care provider if: ? You fall. Be sure to tell your health care provider about all falls, even ones that seem minor. ? You feel dizzy, sleepy, or off-balance.  Take over-the-counter and prescription medicines only as told by your health care provider. These include any supplements.  Eat a healthy diet and maintain a healthy weight. A healthy diet includes low-fat dairy products, low-fat (lean) meats, and fiber from whole grains, beans, and lots of fruits and vegetables. Home safety  Remove any tripping hazards, such as rugs, cords, and clutter.  Install safety equipment such as grab bars in bathrooms and safety rails  on stairs.  Keep rooms and walkways well-lit. Activity   Follow a regular exercise program to stay fit. This will help you maintain your balance. Ask your health care provider what types of exercise are appropriate for you.  If you need a cane or walker, use it as recommended by your health care provider.  Wear supportive shoes that have nonskid soles. Lifestyle  Do not drink alcohol if your health care provider  tells you not to drink.  If you drink alcohol, limit how much you have: ? 0-1 drink a day for women. ? 0-2 drinks a day for men.  Be aware of how much alcohol is in your drink. In the [place] one drink equals one typical bottle of beer (12 oz), one-half glass of wine (5 oz), or one shot of hard liquor (1 oz).  Do not use any products that contain nicotine or tobacco, such as cigarettes and e-cigarettes. If you need help quitting, ask your health care provider. Summary  Having a healthy lifestyle and getting preventive care can help to protect your health and wellness after age 30.  Screening and testing are the best way to find a health problem early and help you avoid having a fall. Early diagnosis and treatment give you the best chance for managing medical conditions that are more common for people who are older than age 93.  Falls are a major cause of broken bones and head injuries in people who are older than age 31. Take precautions to prevent a fall at home.  Work with your health care provider to learn what changes you can make to improve your health and wellness and to prevent falls. This information is not intended to replace advice given to you by your health care provider. Make sure you discuss any questions you have with your health care provider. Document Revised: 02/17/2019 Document Reviewed: 09/09/2017 Elsevier Patient Education  2020 [employer].

## 2020-01-09 ENCOUNTER — Ambulatory Visit: Payer: 59 | Admitting: *Deleted

## 2020-01-17 ENCOUNTER — Encounter: Payer: Self-pay | Admitting: *Deleted

## 2020-01-17 ENCOUNTER — Other Ambulatory Visit: Payer: Self-pay

## 2020-01-17 ENCOUNTER — Encounter: Payer: 59 | Attending: Family Medicine | Admitting: *Deleted

## 2020-01-17 VITALS — BP 120/74 | Ht 70.5 in | Wt 191.7 lb

## 2020-01-17 DIAGNOSIS — E119 Type 2 diabetes mellitus without complications: Secondary | ICD-10-CM | POA: Insufficient documentation

## 2020-01-17 NOTE — Progress Notes (Signed)
Diabetes Self-Management Education  Visit Type: First/Initial  Appt. Start Time: 1315 Appt. End Time: 1430  01/17/2020  Edward Lester, identified by name and date of birth, is a 74 y.o. male with a diagnosis of Diabetes: Type 2.   ASSESSMENT  Blood pressure 120/74, height 5' 10.5" (1.791 m), weight 191 lb 11.2 oz (87 kg). Body mass index is 27.12 kg/m.  Diabetes Self-Management Education - 01/17/20 1543      Visit Information   Visit Type  First/Initial      Initial Visit   Diabetes Type  Type 2    Are you currently following a meal plan?  No    Are you taking your medications as prescribed?  Yes    Date Diagnosed  "2 years maybe"      Health Coping   How would you rate your overall health?  Excellent      Psychosocial Assessment   Patient Belief/Attitude about Diabetes  Motivated to manage diabetes    Self-care barriers  None    Self-management support  Doctor's office;Family    Patient Concerns  Nutrition/Meal planning;Glycemic Control    Special Needs  None    Preferred Learning Style  Hands on    Learning Readiness  Change in progress    How often do you need to have someone help you when you read instructions, pamphlets, or other written materials from your doctor or pharmacy?  1 - Never    What is the last grade level you completed in school?  BA degree      Pre-Education Assessment   Patient understands the diabetes disease and treatment process.  Needs Instruction    Patient understands incorporating nutritional management into lifestyle.  Needs Instruction    Patient undertands incorporating physical activity into lifestyle.  Needs Instruction    Patient understands using medications safely.  Needs Instruction    Patient understands monitoring blood glucose, interpreting and using results  Needs Instruction    Patient understands prevention, detection, and treatment of acute complications.  Needs Instruction    Patient understands prevention, detection, and  treatment of chronic complications.  Needs Instruction    Patient understands how to develop strategies to address psychosocial issues.  Needs Instruction    Patient understands how to develop strategies to promote health/change behavior.  Needs Instruction      Complications   Last HgB A1C per patient/outside source  6.8 %   11/02/2019   How often do you check your blood sugar?  Not recommended by provider   Blood sugar checked today in the office with reading of 103 mg/dL at 2:25 pm - 3 1/2 hrs pp.   Have you had a dilated eye exam in the past 12 months?  Yes    Have you had a dental exam in the past 12 months?  Yes    Are you checking your feet?  No      Dietary Intake   Breakfast  sometimes skips - cereal and milk; egg sandwich; [place] biscuit    Lunch  cold cut sandwich with cheese    Dinner  chicken, fish, occasional pork - potatoes, greens, corn, peas, beans, couscous, occasional pasta, romaine lettuce, spinach, cabbage, broccoli, cauliflower    Snack (evening)  was eating chocolate bars and [employer] cookies until diagnosis    Beverage(s)  water, milk, coffee      Exercise   Exercise Type  Light (walking / raking leaves)    How many days  per week to you exercise?  4    How many minutes per day do you exercise?  120    Total minutes per week of exercise  480      Patient Education   Previous Diabetes Education  No    Disease state   Definition of diabetes, type 1 and 2, and the diagnosis of diabetes;Factors that contribute to the development of diabetes    Nutrition management   Role of diet in the treatment of diabetes and the relationship between the three main macronutrients and blood glucose level;Food label reading, portion sizes and measuring food.;Reviewed blood glucose goals for pre and post meals and how to evaluate the patients' food intake on their blood glucose level.;Effects of alcohol on blood glucose and safety factors with consumption of alcohol.     Physical activity and exercise   Role of exercise on diabetes management, blood pressure control and cardiac health.    Monitoring  Taught/discussed recording of test results and interpretation of SMBG.;Identified appropriate SMBG and/or A1C goals.    Chronic complications  Relationship between chronic complications and blood glucose control    Psychosocial adjustment  Identified and addressed patients feelings and concerns about diabetes      Individualized Goals (developed by patient)   Reducing Risk  Other (comment)   Improve blood sugars     Outcomes   Expected Outcomes  Demonstrated interest in learning. Expect positive outcomes    Future DMSE  2 wks       Individualized Plan for Diabetes Self-Management Training:   Learning Objective:  Patient will have a greater understanding of diabetes self-management. Patient education plan is to attend individual and/or group sessions per assessed needs and concerns.   Plan:   Patient Instructions  Exercise: Continue walking when playing golf  3-4 days a week Eat 3 meals day,  1-2 snacks a day Space meals 4-6 hours apart Don't skip meals Include protein when eating cereal and milk Complete 3 Day Food Record and bring to next appt Return for appointment on:  Wednesday February 01, 2020 at 10:30 am with Edward Lester (nurse)  Expected Outcomes:  Demonstrated interest in learning. Expect positive outcomes  Education material provided: General Meal Planning Guidelines Simple Meal Plan 3 Day Food Record  If problems or questions, patient to contact team via:  Edward Drilling, RN, CCM, CDE [phone]  Future DSME appointment: 2 wks  The patient didn't want to attend Diabetes classes but agreed to come for the 2 Hour Refresher Program. His next appointment is scheduled for February 01, 2020 with this nurse. He didn't want to see a dietitian at this time.

## 2020-01-17 NOTE — Patient Instructions (Addendum)
Exercise: Continue walking when playing golf  3-4 days a week  Eat 3 meals day,  1-2 snacks a day Space meals 4-6 hours apart Don't skip meals Include protein when eating cereal and milk Complete 3 Day Food Record and bring to next appt  Return for appointment on:  Wednesday February 01, 2020 at 10:30 am with Freda Munro (nurse)

## 2020-01-27 ENCOUNTER — Ambulatory Visit: Payer: 59 | Attending: Internal Medicine

## 2020-01-27 DIAGNOSIS — Z23 Encounter for immunization: Secondary | ICD-10-CM

## 2020-01-27 NOTE — Progress Notes (Signed)
   Covid-19 Vaccination Clinic  Name:  Edward Lester    MRN: [id number] DOB: [birth year]  01/27/2020  Mr. Edward Lester was observed post Covid-19 immunization for 15 minutes without incident. He was provided with Vaccine Information Sheet and instruction to access the V-Safe system.   Mr. Edward Lester was instructed to call 911 with any severe reactions post vaccine: Marland Kitchen Difficulty breathing  . Swelling of face and throat  . A fast heartbeat  . A bad rash all over body  . Dizziness and weakness   Immunizations Administered    Name Date Dose VIS Date Route   Moderna COVID-19 Vaccine 01/27/2020 10:08 AM 0.5 mL 10/11/2019 Intramuscular   Manufacturer: Moderna   Lot: [id number]   [id number]

## 2020-02-01 ENCOUNTER — Encounter: Payer: Self-pay | Admitting: *Deleted

## 2020-02-01 ENCOUNTER — Encounter: Payer: 59 | Admitting: *Deleted

## 2020-02-01 ENCOUNTER — Other Ambulatory Visit: Payer: Self-pay

## 2020-02-01 VITALS — BP 120/74 | Wt 190.5 lb

## 2020-02-01 DIAGNOSIS — E119 Type 2 diabetes mellitus without complications: Secondary | ICD-10-CM | POA: Diagnosis not present

## 2020-02-01 NOTE — Patient Instructions (Addendum)
Eat 3 meals day,  1-2  snacks a day Space meals 4-6 hours apart Include 1 protein serving with meals and when eating fruit for a snack Continue not skipping meals and have at least 1 protein and 1 carbohydrate serving at meal time  Exercise: Continue walking when playing golf 3-4 days a week  Call back if you have any questions

## 2020-02-01 NOTE — Progress Notes (Signed)
Diabetes Self-Management Education  Visit Type: Follow-up  Appt. Start Time: 1035 Appt. End Time: [id number]  02/01/2020  Edward Lester, identified by name and date of birth, is a 74 y.o. male with a diagnosis of Diabetes:  Type 2.   ASSESSMENT  Blood pressure 120/74, weight 190 lb 8 oz (86.4 kg). Body mass index is 26.95 kg/m.  Diabetes Self-Management Education - 02/01/20 1237      Visit Information   Visit Type  Follow-up      Complications   Have you had a dilated eye exam in the past 12 months?  Yes    Have you had a dental exam in the past 12 months?  Yes    Are you checking your feet?  Yes    How many days per week are you checking your feet?  3      Dietary Intake   Breakfast  3 meals and 1-2 snacks/day (see 3 Day Food Record)      Exercise   Exercise Type  Light (walking / raking leaves)   golf   How many days per week to you exercise?  3    How many minutes per day do you exercise?  120    Total minutes per week of exercise  360      Patient Education   Disease state   Factors that contribute to the development of diabetes    Nutrition management   Food label reading, portion sizes and measuring food.;Carbohydrate counting    Physical activity and exercise   Role of exercise on diabetes management, blood pressure control and cardiac health.    Monitoring  Identified appropriate SMBG and/or A1C goals.    Chronic complications  Relationship between chronic complications and blood glucose control;Assessed and discussed foot care and prevention of foot problems    Psychosocial adjustment  Identified and addressed patients feelings and concerns about diabetes      Post-Education Assessment   Patient understands incorporating nutritional management into lifestyle.  Demonstrates understanding / competency    Patient undertands incorporating physical activity into lifestyle.  Demonstrates understanding / competency    Patient understands using medications safely.  Needs  Review    Patient understands monitoring blood glucose, interpreting and using results  Needs Review    Patient understands prevention, detection, and treatment of acute complications.  Needs Review    Patient understands prevention, detection, and treatment of chronic complications.  Demonstrates understanding / competency    Patient understands how to develop strategies to address psychosocial issues.  Demonstrates understanding / competency    Patient understands how to develop strategies to promote health/change behavior.  Demonstrates understanding / competency      Outcomes   Expected Outcomes  Demonstrated interest in learning. Expect positive outcomes    Future DMSE  PRN    Program Status  Completed      Subsequent Visit   Since your last visit have you continued or begun to take your medications as prescribed?  Yes    Since your last visit have you had your blood pressure checked?  No    Since your last visit have you experienced any weight changes?  Loss    Weight Loss (lbs)  1    Since your last visit, are you checking your blood glucose at least once a day?  N/A       Individualized Plan for Diabetes Self-Management Training:   Learning Objective:  Patient will have a greater understanding of  diabetes self-management. Patient education plan is to attend individual and/or group sessions per assessed needs and concerns.   Plan:   Patient Instructions  Eat 3 meals day,  1-2  snacks a day Space meals 4-6 hours apart Include 1 protein serving with meals and when eating fruit for a snack Continue not skipping meals and have at least 1 protein and 1 carbohydrate serving at meal time Exercise: Continue walking when playing golf 3-4 days a week Call back if you have any questions  Expected Outcomes:  Demonstrated interest in learning. Expect positive outcomes  Education material provided:  Planning a Balanced Meal How to Thrive: A Guide for Your Journey with Diabetes  (ADA)  If problems or questions, patient to contact team via:  Edward Drilling, RN, [place], [place] [phone]  Future DSME appointment: PRN

## 2020-02-04 IMAGING — DX DG KNEE COMPLETE 4+V*R*
5 series · 5 of 5 positions shown · non-contrast
Comparison: 09/04/2014

CLINICAL DATA: Right knee pain and swelling, osteoarthritis

EXAM:
RIGHT KNEE - COMPLETE 4+ VIEW

[knee ap]
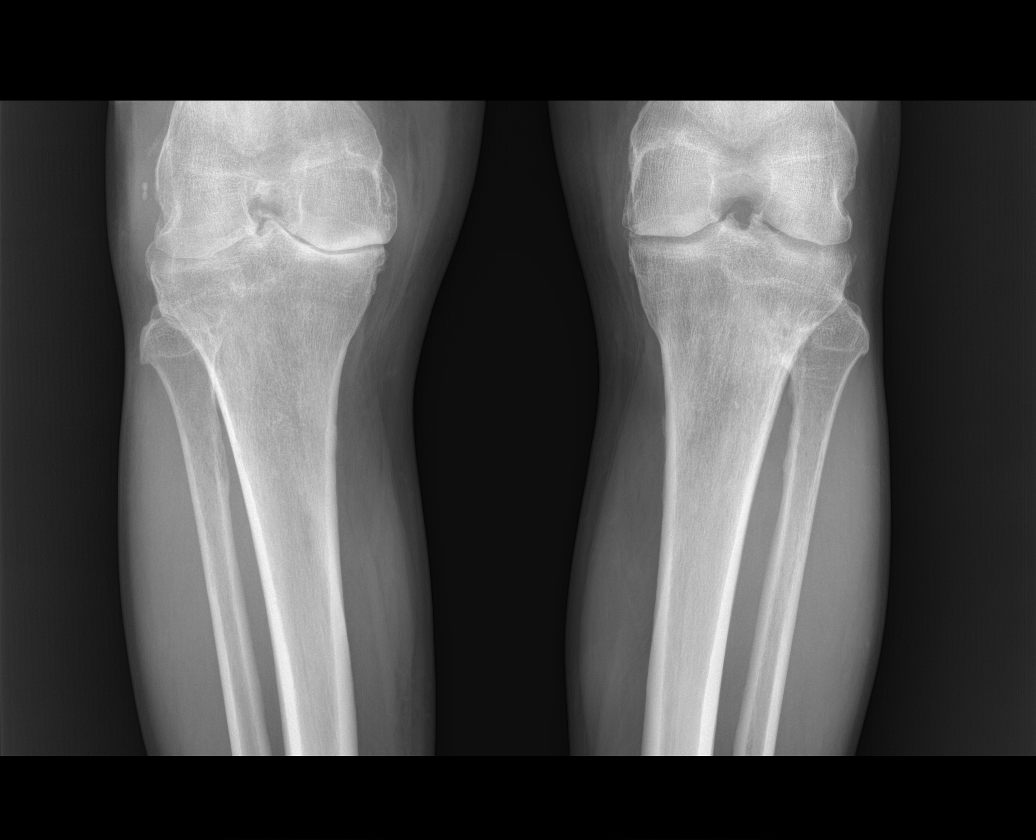

[knee lat]
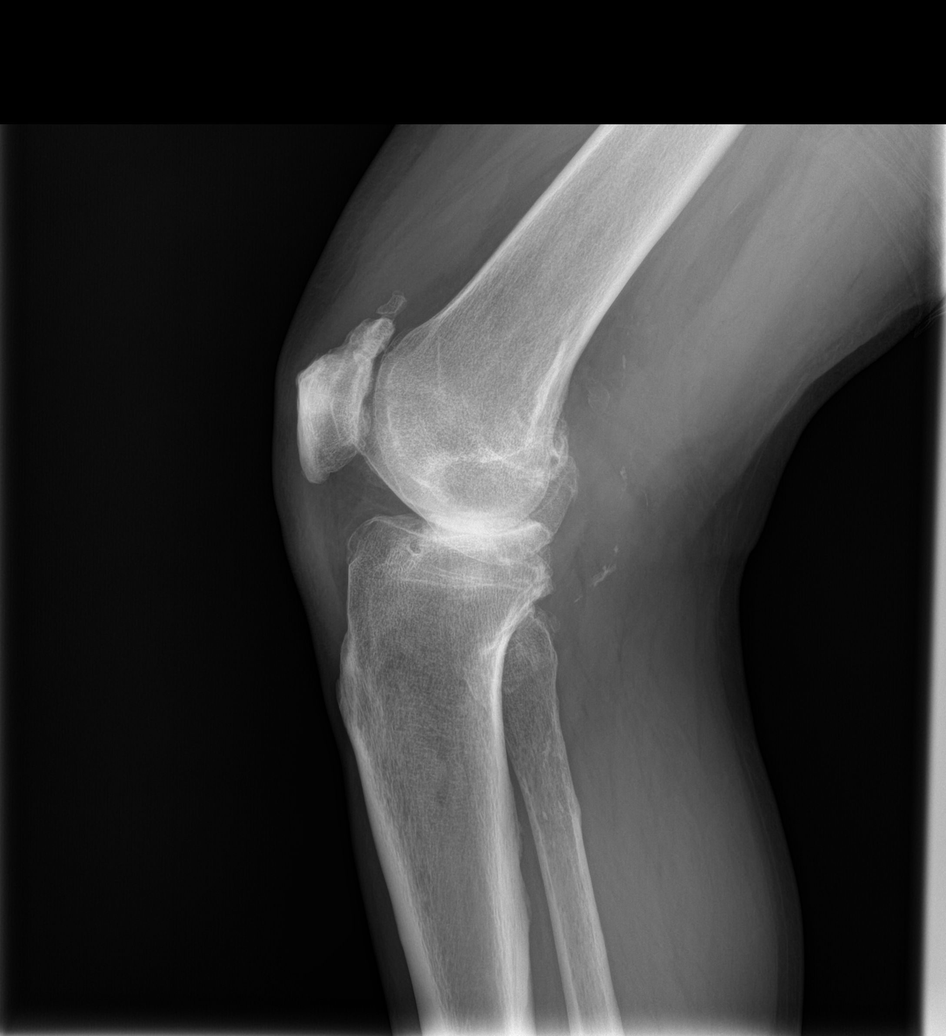

[patella skyline]
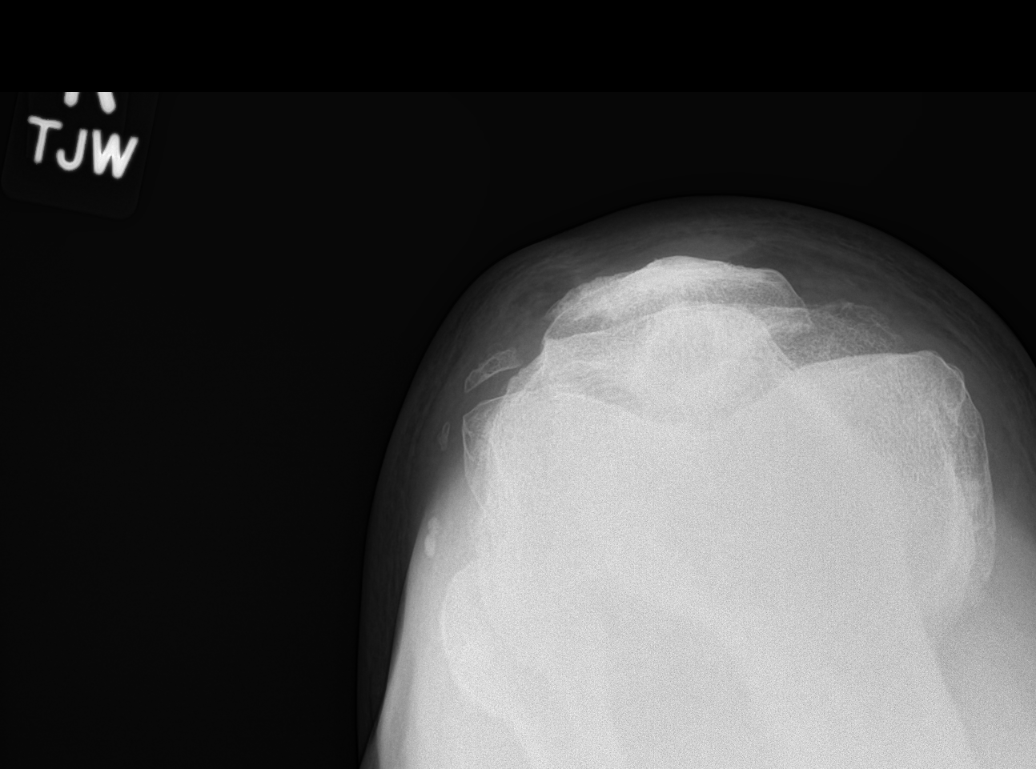

[knee obl (1 of 2)]
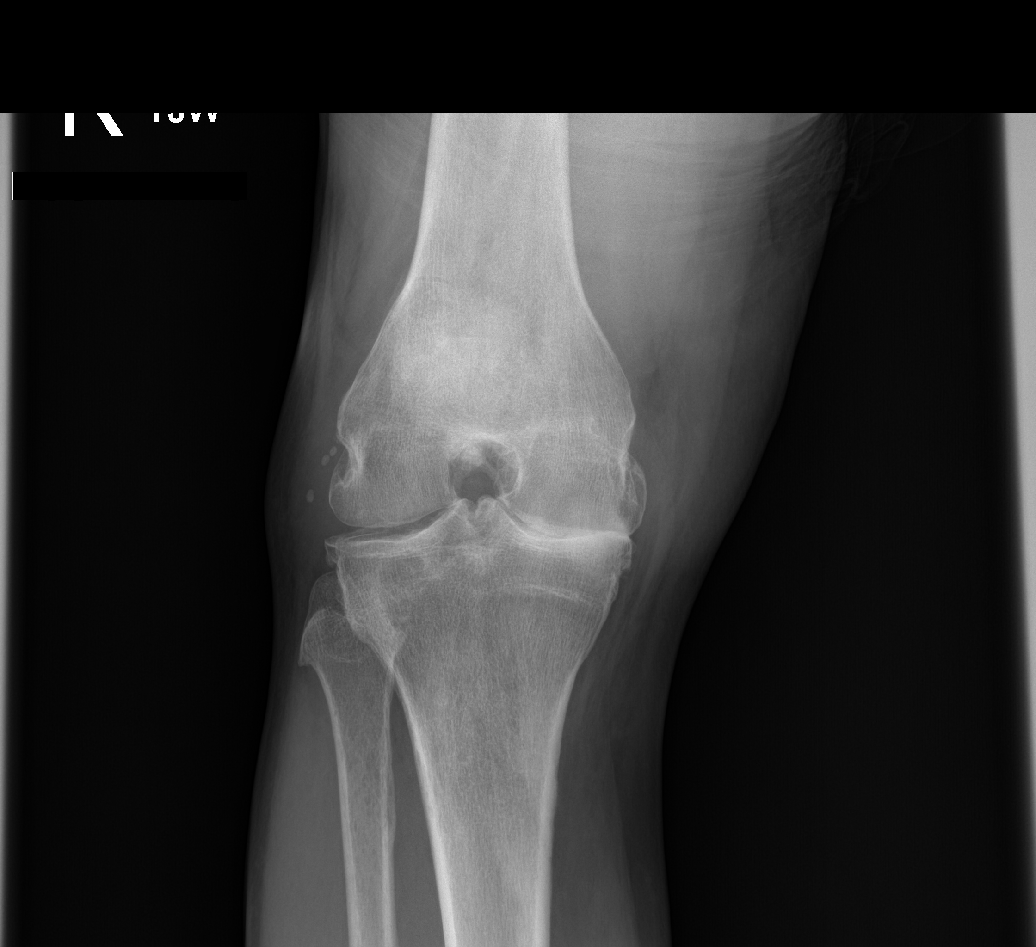

[knee obl (2 of 2)]
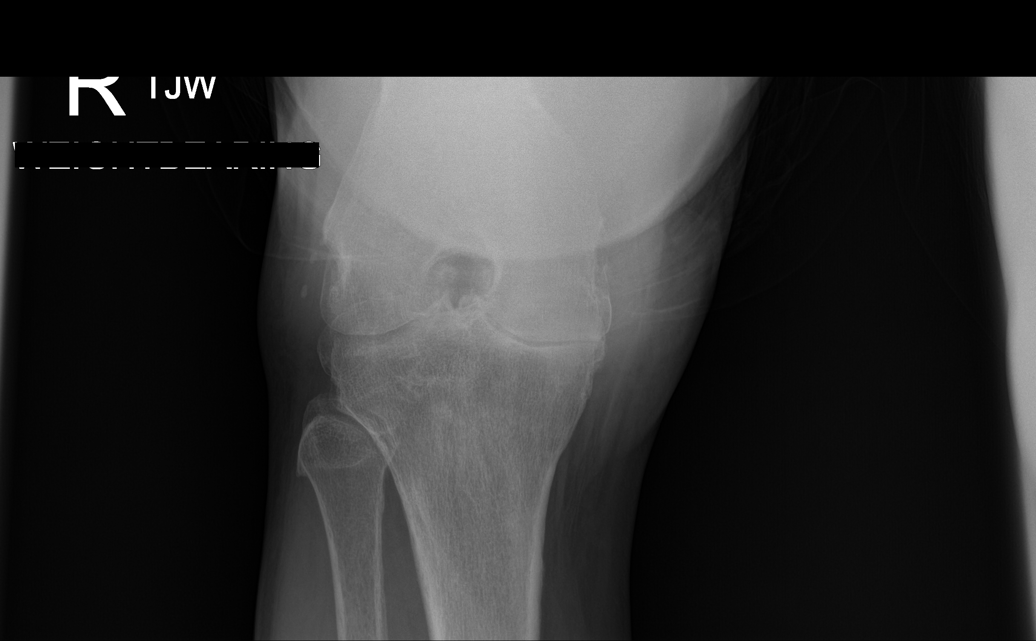

[5 of 5 positions shown; findings below may reference images not displayed]

FINDINGS: Progressive tricompartmental severe right knee osteoarthritis with
marked joint space loss, sclerosis and bony spurring. Medial
compartment is most affected with bone-on-bone contact. No acute
osseous finding, fracture or effusion. No definite soft tissue
abnormality.
IMPRESSION: Progressive severe right knee tricompartmental osteoarthritis, most
severe in the medial compartment compare to 09/04/2014.

No acute osseous finding or large effusion.

## 2020-02-29 ENCOUNTER — Ambulatory Visit: Payer: 59 | Attending: Internal Medicine

## 2020-02-29 DIAGNOSIS — Z23 Encounter for immunization: Secondary | ICD-10-CM

## 2020-02-29 NOTE — Progress Notes (Signed)
   Covid-19 Vaccination Clinic  Name:  Edward Lester    MRN: [id number] DOB: 06/21/1946  02/29/2020  Edward Lester was observed post Covid-19 immunization for 15 minutes without incident. He was provided with Vaccine Information Sheet and instruction to access the V-Safe system.   Edward Lester was instructed to call 911 with any severe reactions post vaccine: Marland Kitchen Difficulty breathing  . Swelling of face and throat  . A fast heartbeat  . A bad rash all over body  . Dizziness and weakness   Immunizations Administered    Name Date Dose VIS Date Route   Moderna COVID-19 Vaccine 02/29/2020  9:39 AM 0.5 mL 10/2019 Intramuscular   Manufacturer: Moderna   Lot: [id number]   [id number]

## 2020-04-16 LAB — HM DIABETES EYE EXAM

## 2020-04-18 ENCOUNTER — Encounter: Payer: Self-pay | Admitting: Family Medicine

## 2020-05-27 ENCOUNTER — Other Ambulatory Visit: Payer: Self-pay | Admitting: Family Medicine

## 2020-05-27 ENCOUNTER — Encounter: Payer: Self-pay | Admitting: Family Medicine

## 2020-05-28 MED ORDER — TADALAFIL 20 MG PO TABS: 10.0000 mg | ORAL_TABLET | ORAL | 11 refills | Status: DC | PRN

## 2020-08-27 ENCOUNTER — Emergency Department: Payer: 59

## 2020-08-27 ENCOUNTER — Encounter: Payer: Self-pay | Admitting: Emergency Medicine

## 2020-08-27 ENCOUNTER — Emergency Department
Admission: EM | Admit: 2020-08-27 | Discharge: 2020-08-27 | Disposition: A | Discharge status: Home / Self Care | Payer: 59 | Attending: Emergency Medicine | Admitting: Emergency Medicine

## 2020-08-27 ENCOUNTER — Other Ambulatory Visit: Payer: Self-pay

## 2020-08-27 DIAGNOSIS — I119 Hypertensive heart disease without heart failure: Secondary | ICD-10-CM | POA: Insufficient documentation

## 2020-08-27 DIAGNOSIS — Z87891 Personal history of nicotine dependence: Secondary | ICD-10-CM | POA: Insufficient documentation

## 2020-08-27 DIAGNOSIS — T148XXA Other injury of unspecified body region, initial encounter: Secondary | ICD-10-CM

## 2020-08-27 DIAGNOSIS — I2510 Atherosclerotic heart disease of native coronary artery without angina pectoris: Secondary | ICD-10-CM | POA: Diagnosis not present

## 2020-08-27 DIAGNOSIS — R079 Chest pain, unspecified: Secondary | ICD-10-CM

## 2020-08-27 DIAGNOSIS — S29011A Strain of muscle and tendon of front wall of thorax, initial encounter: Secondary | ICD-10-CM | POA: Diagnosis not present

## 2020-08-27 DIAGNOSIS — Y9353 Activity, golf: Secondary | ICD-10-CM | POA: Insufficient documentation

## 2020-08-27 DIAGNOSIS — S299XXA Unspecified injury of thorax, initial encounter: Secondary | ICD-10-CM | POA: Diagnosis present

## 2020-08-27 DIAGNOSIS — E1136 Type 2 diabetes mellitus with diabetic cataract: Secondary | ICD-10-CM | POA: Insufficient documentation

## 2020-08-27 DIAGNOSIS — X58XXXA Exposure to other specified factors, initial encounter: Secondary | ICD-10-CM | POA: Insufficient documentation

## 2020-08-27 LAB — BASIC METABOLIC PANEL
Anion gap: 9 (ref 5–15)
BUN: 16 mg/dL (ref 8–23)
CO2: 23 mmol/L (ref 22–32)
Calcium: 8.8 mg/dL — ABNORMAL LOW (ref 8.9–10.3)
Chloride: 106 mmol/L (ref 98–111)
Creatinine, Ser: 1.02 mg/dL (ref 0.61–1.24)
GFR, Estimated: >60 mL/min (ref >60)
Glucose, Bld: 180 mg/dL — ABNORMAL HIGH (ref 70–99)
Potassium: 4 mmol/L (ref 3.5–5.1)
Sodium: 138 mmol/L (ref 135–145)

## 2020-08-27 LAB — CBC
HCT: 43.7 % (ref 39.0–52.0)
Hemoglobin: 14.7 g/dL (ref 13.0–17.0)
MCH: 30.4 pg (ref 26.0–34.0)
MCHC: 33.6 g/dL (ref 30.0–36.0)
MCV: 90.5 fL (ref 80.0–100.0)
Platelets: 252 10*3/uL (ref 150–400)
RBC: 4.83 MIL/uL (ref 4.22–5.81)
RDW: 13.6 % (ref 11.5–15.5)
WBC: 6.6 10*3/uL (ref 4.0–10.5)
nRBC: 0 % (ref 0.0–0.2)

## 2020-08-27 LAB — TROPONIN I (HIGH SENSITIVITY)
Troponin I (High Sensitivity): 7 ng/L (ref <18)
Troponin I (High Sensitivity): 6 ng/L (ref <18)

## 2020-08-27 LAB — FIBRIN DERIVATIVES D-DIMER (ARMC ONLY): Fibrin derivatives D-dimer (ARMC): 577.13 ng/mL (FEU) — ABNORMAL HIGH (ref 0.00–499.00)

## 2020-08-27 MED ORDER — IOHEXOL 350 MG/ML SOLN
75.0000 mL | Freq: Once | INTRAVENOUS | Status: AC | PRN
  Administered 2020-08-27: 75 mL via INTRAVENOUS
  Filled 2020-08-27: qty 75

## 2020-08-27 NOTE — ED Triage Notes (Signed)
Pt called from [facility] to treatment room, no response 

## 2020-08-27 NOTE — ED Provider Notes (Signed)
[facility] Emergency Department Provider Note   ____________________________________________   First MD Initiated Contact with Patient 08/27/20 1443     (approximate)  I have reviewed the triage vital signs and the nursing notes.   HISTORY  Chief Complaint Chest Pain    HPI Edward Lester is a 74 y.o. male with possible history of hypertension and hyperlipidemia who presents to the ED complaining of chest pain.  Patient reports he has been having 24 to 48 hours of sharp pain in the left side of his chest when he goes to take a deep breath.  He denies any pain at rest or with exertion, but when he takes a deep enough breath he starts to have the left-sided chest pain.  He denies any fevers, cough, or difficulty breathing.  He does state he played multiple rounds of golf earlier this week and thinks he could have strained a muscle.  He denies any pain or swelling in his legs, does not have any history of DVT/PE or recent surgery.        Past Medical History:  Diagnosis Date  . Aortic atherosclerosis ([place]) 12/29/2016   By CT  . CAD (coronary artery disease) 12/29/2016   By CT  . Diabetes mellitus type 2, controlled, without complications ([place]) 04/13/159  . ED (erectile dysfunction) of organic origin   . Emphysema lung ([place]) 12/29/2016   Moderate changes of centrilobular and paraseptal emphysema identified. Diffuse bronchial wall thickening noted. By CT 12/2016  . Ex-smoker quit ~2005   30 PY hx  . History of colonic polyps   . HLD (hyperlipidemia)   . Osteoarthritis of knee 2015   severe predominantly R  . Refusal of blood transfusions as patient is Jehovah's Witness     Patient Active Problem List   Diagnosis Date Noted  . Polyp of sigmoid colon   . Benign neoplasm of ascending colon   . Pain in both hands 12/15/2018  . Tinea pedis 12/09/2017  . Aortic atherosclerosis ([place]) 12/29/2016  . Coronary artery calcification 12/29/2016  . Emphysema lung  ([place]) 12/29/2016  . Pulmonary nodule 12/29/2016  . Carpal tunnel syndrome on right 12/29/2016  . Personal history of tobacco use, presenting hazards to health 12/23/2016  . Ex-smoker 12/08/2016  . Health care maintenance 11/20/2014  . Advanced care planning/counseling discussion 11/20/2014  . Controlled type 2 diabetes mellitus with cataract ([place]) 11/11/2014  . Osteoarthritis of right knee 09/04/2014  . Hyperlipidemia associated with type 2 diabetes mellitus ([place])   . ED (erectile dysfunction) of organic origin     Past Surgical History:  Procedure Laterality Date  . CARPAL TUNNEL RELEASE Left 2013  . COLONOSCOPY  2009   1 hyperplastic polyp [facility])  . COLONOSCOPY WITH PROPOFOL N/A 01/04/2019   TAx2, HPx1, diverticulosis, rpt 5 yrs [name], [name], MD)  . FOOT SURGERY Left 2014   bone seperation on little toe  . LIPOMA EXCISION  2005   right back    Prior to Admission medications   Medication Sig Start Date End Date Taking? Authorizing Provider  atorvastatin (LIPITOR) 40 MG tablet Take 1 tablet (40 mg total) by mouth daily. 12/19/19   Ria Bush, MD  naproxen (NAPROSYN) 500 MG tablet TAKE 1 TABLET (500 MG TOTAL) BY MOUTH 2 (TWO) TIMES DAILY WITH A MEAL IF NEEDED Patient not taking: Reported on 01/17/2020 12/19/19   Ria Bush, MD  Omega-3 Fatty Acids (OMEGA 3 PO) Take 1 capsule by mouth 2 (two) times daily.  [provider]  tadalafil (CIALIS) 20 MG tablet Take 0.5-1 tablets (10-20 mg total) by mouth every other day as needed for erectile dysfunction. 05/28/20   Ria Bush, MD    Allergies Patient has no known allergies.  Family History  Problem Relation Age of Onset  . Lung cancer Mother 7       (smoker)  . Stomach cancer Father 16       (smoker)  . Thyroid disease Sister   . CAD Neg Hx   . Stroke Neg Hx   . Diabetes Neg Hx     Social History Social History   Tobacco Use  . Smoking status: Former Smoker    Packs/day: 1.00     Years: 30.00    Pack years: 30.00    Types: Cigarettes    Start date: 11/11/1967    Quit date: 11/09/2004    Years since quitting: 15.8  . Smokeless tobacco: Never Used  Vaping Use  . Vaping Use: Never used  Substance Use Topics  . Alcohol use: Yes    Alcohol/week: 2.0 standard drinks    Types: 1 Cans of beer, 1 Glasses of wine per week    Comment: social  . Drug use: No    Review of Systems  Constitutional: No fever/chills Eyes: No visual changes. ENT: No sore throat. Cardiovascular: Positive for chest pain. Respiratory: Denies shortness of breath. Gastrointestinal: No abdominal pain.  No nausea, no vomiting.  No diarrhea.  No constipation. Genitourinary: Negative for dysuria. Musculoskeletal: Negative for back pain. Skin: Negative for rash. Neurological: Negative for headaches, focal weakness or numbness.  ____________________________________________   PHYSICAL EXAM:  VITAL SIGNS: ED Triage Vitals  Enc Vitals Group     BP 08/27/20 0828 (!) 156/81     Pulse Rate 08/27/20 0828 65     Resp 08/27/20 0828 17     Temp 08/27/20 0828 98 F (36.7 C)     Temp Source 08/27/20 0828 Oral     SpO2 08/27/20 0828 99 %     Weight 08/27/20 0859 190 lb 7.6 oz (86.4 kg)     Height 08/27/20 0859 5' 10.5" (1.791 m)     Head Circumference --      Peak Flow --      Pain Score 08/27/20 0858 1     Pain Loc --      Pain Edu? --      Excl. in [place]? --     Constitutional: Alert and oriented. Eyes: Conjunctivae are normal. Head: Atraumatic. Nose: No congestion/rhinnorhea. Mouth/Throat: Mucous membranes are moist. Neck: Normal ROM Cardiovascular: Normal rate, regular rhythm. Grossly normal heart sounds.  2+ radial pulses bilaterally. Respiratory: Normal respiratory effort.  No retractions. Lungs CTAB.  No chest wall tenderness to palpation. Gastrointestinal: Soft and nontender. No distention. Genitourinary: deferred Musculoskeletal: No lower extremity tenderness nor  edema. Neurologic:  Normal speech and language. No gross focal neurologic deficits are appreciated. Skin:  Skin is warm, dry and intact. No rash noted. Psychiatric: Mood and affect are normal. Speech and behavior are normal.  ____________________________________________   LABS (all labs ordered are listed, but only abnormal results are displayed)  Labs Reviewed  BASIC METABOLIC PANEL - Abnormal; Notable for the following components:      Result Value   Glucose, Bld 180 (*)    Calcium 8.8 (*)    All other components within normal limits  FIBRIN DERIVATIVES D-DIMER ([facility] ONLY) - Abnormal; Notable for the following components:   Fibrin  derivatives D-dimer ([facility]) 577.13 (*)    All other components within normal limits  CBC  TROPONIN I (HIGH SENSITIVITY)  TROPONIN I (HIGH SENSITIVITY)   ____________________________________________  EKG  ED ECG REPORT I, Blake Divine, the attending physician, personally viewed and interpreted this ECG.   Date: 08/27/2020  EKG Time: 8:28  Rate: 59  Rhythm: sinus bradycardia  Axis: Normal  Intervals:none  ST&T Change: None   PROCEDURES  Procedure(s) performed (including Critical Care):  Procedures   ____________________________________________   INITIAL IMPRESSION / ASSESSMENT AND PLAN / ED COURSE       74 year old male with past medical history of hypertension and hyperlipidemia presents to the ED complaining of sharp pleuritic chest pain on the left side when he goes to take a deep breath for the past 2 days.  He has had CAD previously identified on CT scan but has no significant cardiac history or history of MI.  His symptoms sound very atypical for ACS, initial EKG shows no evidence of arrhythmia or ischemia and initial troponin is negative.  If repeat troponin is within normal limits, it seems that he has a very low likelihood of ACS.  It could be that he has muscle strain related to recent rounds of golf, however his pain is not  reproducible with chest palpation.  He is low risk for PE by [place], we will check D-dimer but if this is negative I doubt PE.  Chest x-ray is negative for acute process by my read.  D-dimer elevated, CTA chest performed and negative for PE or other acute process.  At this point, I suspect patient's pain is due to muscle strain in his chest and he is appropriate for discharge home.  He was counseled to follow-up with his PCP and otherwise return to the ED for new worsening symptoms, patient agrees with plan.      ____________________________________________   FINAL CLINICAL IMPRESSION(S) / ED DIAGNOSES  Final diagnoses:  Nonspecific chest pain  Muscle strain     ED Discharge Orders    None       Note:  This document was prepared using Dragon voice recognition software and may include unintentional dictation errors.   Blake Divine, MD 08/27/20 1758

## 2020-08-27 NOTE — ED Notes (Signed)
Patient discharged home with wife, patient received discharge papers. Patient appropriate and cooperative. Vital signs taken. NAD noted.

## 2020-08-27 NOTE — ED Notes (Signed)
No answer in the waiting room

## 2020-08-27 NOTE — ED Notes (Signed)
Patient is here due to pain located in mid left of chest. Patient states when he takes a big deep breathe he feels the pain. States he has not done anything different with lifestyle.

## 2020-08-27 NOTE — ED Triage Notes (Signed)
C/O left sided chest pain with inspiration x 2 days.  No SOB/ DOE. NAD

## 2020-10-10 ENCOUNTER — Ambulatory Visit: Payer: 59 | Admitting: Podiatry

## 2020-10-10 ENCOUNTER — Encounter: Payer: Self-pay | Admitting: Podiatry

## 2020-10-10 ENCOUNTER — Other Ambulatory Visit: Payer: Self-pay

## 2020-10-10 DIAGNOSIS — L603 Nail dystrophy: Secondary | ICD-10-CM | POA: Diagnosis not present

## 2020-10-10 NOTE — Progress Notes (Signed)
°  Subjective:  Patient ID: Edward Lester, male    DOB: 1946-04-09,  MRN: [id number] HPI Chief Complaint  Patient presents with   Nail Problem    1st left and 2nd right - toenails thick and dark x several months, sometimes gets these little "zingers" at the tip of the hallux left   New Patient (Initial Visit)    74 y.o. male presents with the above complaint.   ROS: Denies fever chills nausea vomiting muscle aches pains calf pain back pain chest pain shortness of breath.  Past Medical History:  Diagnosis Date   Aortic atherosclerosis ([place]) 12/29/2016   By CT   CAD (coronary artery disease) 12/29/2016   By CT   Diabetes mellitus type 2, controlled, without complications ([place]) 12/15/537   ED (erectile dysfunction) of organic origin    Emphysema lung ([place]) 12/29/2016   Moderate changes of centrilobular and paraseptal emphysema identified. Diffuse bronchial wall thickening noted. By CT 12/2016   Ex-smoker quit ~2005   30 PY hx   History of colonic polyps    HLD (hyperlipidemia)    Osteoarthritis of knee 2015   severe predominantly R   Refusal of blood transfusions as patient is Jehovah's Witness    Past Surgical History:  Procedure Laterality Date   CARPAL TUNNEL RELEASE Left 2013   COLONOSCOPY  2009   1 hyperplastic polyp [facility])   COLONOSCOPY WITH PROPOFOL N/A 01/04/2019   TAx2, HPx1, diverticulosis, rpt 5 yrs [name], [name], MD)   FOOT SURGERY Left 2014   bone seperation on little toe   LIPOMA EXCISION  2005   right back    Current Outpatient Medications:    simvastatin (ZOCOR) 20 MG tablet, , Disp: , Rfl:    Omega-3 Fatty Acids (OMEGA 3 PO), Take 1 capsule by mouth 2 (two) times daily., Disp: , Rfl:    tadalafil (CIALIS) 20 MG tablet, Take 0.5-1 tablets (10-20 mg total) by mouth every other day as needed for erectile dysfunction., Disp: 15 tablet, Rfl: 11  No Known Allergies Review of Systems Objective:  There were no vitals filed for this  visit.  General: Well developed, nourished, in no acute distress, alert and oriented x3   Dermatological: Skin is warm, dry and supple bilateral. Nails x 10 are thick yellow dystrophic possibly mycotic; remaining integument appears unremarkable at this time. There are no open sores, no preulcerative lesions, no rash or signs of infection present.  Vascular: Dorsalis Pedis artery and Posterior Tibial artery pedal pulses are 2/4 bilateral with immedate capillary fill time. Pedal hair growth present. No varicosities and no lower extremity edema present bilateral.   Neruologic: Grossly intact via light touch bilateral. Vibratory intact via tuning fork bilateral. Protective threshold with Semmes Wienstein monofilament intact to all pedal sites bilateral. Patellar and Achilles deep tendon reflexes 2+ bilateral. No Babinski or clonus noted bilateral.   Musculoskeletal: No gross boney pedal deformities bilateral. No pain, crepitus, or limitation noted with foot and ankle range of motion bilateral. Muscular strength 5/5 in all groups tested bilateral.  Gait: Unassisted, Nonantalgic.    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

## 2020-11-14 ENCOUNTER — Encounter: Payer: 59 | Admitting: Podiatry

## 2020-11-26 ENCOUNTER — Encounter: Payer: BC Managed Care – PPO | Admitting: Podiatry

## 2020-11-27 ENCOUNTER — Encounter: Payer: Self-pay | Admitting: Family Medicine

## 2020-12-12 ENCOUNTER — Encounter: Payer: BC Managed Care – PPO | Admitting: Podiatry

## 2020-12-23 ENCOUNTER — Other Ambulatory Visit: Payer: Self-pay | Admitting: Family Medicine

## 2020-12-23 DIAGNOSIS — E1169 Type 2 diabetes mellitus with other specified complication: Secondary | ICD-10-CM

## 2020-12-23 DIAGNOSIS — E785 Hyperlipidemia, unspecified: Secondary | ICD-10-CM

## 2020-12-23 DIAGNOSIS — Z125 Encounter for screening for malignant neoplasm of prostate: Secondary | ICD-10-CM

## 2020-12-23 DIAGNOSIS — E1136 Type 2 diabetes mellitus with diabetic cataract: Secondary | ICD-10-CM

## 2020-12-24 ENCOUNTER — Other Ambulatory Visit (INDEPENDENT_AMBULATORY_CARE_PROVIDER_SITE_OTHER): Payer: BC Managed Care – PPO

## 2020-12-24 ENCOUNTER — Other Ambulatory Visit: Payer: Self-pay

## 2020-12-24 DIAGNOSIS — Z125 Encounter for screening for malignant neoplasm of prostate: Secondary | ICD-10-CM

## 2020-12-24 DIAGNOSIS — E1136 Type 2 diabetes mellitus with diabetic cataract: Secondary | ICD-10-CM | POA: Diagnosis not present

## 2020-12-24 DIAGNOSIS — E1169 Type 2 diabetes mellitus with other specified complication: Secondary | ICD-10-CM | POA: Diagnosis not present

## 2020-12-24 DIAGNOSIS — E785 Hyperlipidemia, unspecified: Secondary | ICD-10-CM

## 2020-12-24 LAB — LIPID PANEL
Cholesterol: 135 mg/dL (ref 0–200)
HDL: 41.7 mg/dL (ref >39.00)
LDL Cholesterol: 74 mg/dL (ref 0–99)
NonHDL: 93.17
Total CHOL/HDL Ratio: 3
Triglycerides: 97 mg/dL (ref 0.0–149.0)
VLDL: 19.4 mg/dL (ref 0.0–40.0)

## 2020-12-24 LAB — COMPREHENSIVE METABOLIC PANEL
ALT: 10 U/L (ref 0–53)
AST: 13 U/L (ref 0–37)
Albumin: 3.9 g/dL (ref 3.5–5.2)
Alkaline Phosphatase: 59 U/L (ref 39–117)
BUN: 14 mg/dL (ref 6–23)
CO2: 25 mEq/L (ref 19–32)
Calcium: 8.8 mg/dL (ref 8.4–10.5)
Chloride: 107 mEq/L (ref 96–112)
Creatinine, Ser: 1 mg/dL (ref 0.40–1.50)
GFR: 74.69 mL/min (ref >60.00)
Glucose, Bld: 116 mg/dL — ABNORMAL HIGH (ref 70–99)
Potassium: 4.2 mEq/L (ref 3.5–5.1)
Sodium: 138 mEq/L (ref 135–145)
Total Bilirubin: 0.4 mg/dL (ref 0.2–1.2)
Total Protein: 7 g/dL (ref 6.0–8.3)

## 2020-12-24 LAB — MICROALBUMIN / CREATININE URINE RATIO
Creatinine,U: 96.3 mg/dL
Microalb Creat Ratio: 0.7 mg/g (ref 0.0–30.0)
Microalb, Ur: <0.7 mg/dL (ref 0.0–1.9)

## 2020-12-24 LAB — PSA: PSA: 0.65 ng/mL (ref 0.10–4.00)

## 2020-12-24 LAB — HEMOGLOBIN A1C: Hgb A1c MFr Bld: 6.7 % — ABNORMAL HIGH (ref 4.6–6.5)

## 2020-12-24 NOTE — Addendum Note (Signed)
Addended by: Cloyd Stagers on: 12/24/2020 01:08 PM   Modules accepted: Orders

## 2020-12-31 ENCOUNTER — Other Ambulatory Visit: Payer: Self-pay

## 2020-12-31 ENCOUNTER — Ambulatory Visit (INDEPENDENT_AMBULATORY_CARE_PROVIDER_SITE_OTHER): Payer: BC Managed Care – PPO | Admitting: Family Medicine

## 2020-12-31 ENCOUNTER — Encounter: Payer: Self-pay | Admitting: Family Medicine

## 2020-12-31 VITALS — BP 128/76 | HR 57 | Temp 98.0°F | Ht 69.0 in | Wt 192.1 lb

## 2020-12-31 DIAGNOSIS — Z23 Encounter for immunization: Secondary | ICD-10-CM

## 2020-12-31 DIAGNOSIS — M1711 Unilateral primary osteoarthritis, right knee: Secondary | ICD-10-CM | POA: Diagnosis not present

## 2020-12-31 DIAGNOSIS — B353 Tinea pedis: Secondary | ICD-10-CM

## 2020-12-31 DIAGNOSIS — E785 Hyperlipidemia, unspecified: Secondary | ICD-10-CM

## 2020-12-31 DIAGNOSIS — Z0000 Encounter for general adult medical examination without abnormal findings: Secondary | ICD-10-CM

## 2020-12-31 DIAGNOSIS — E1169 Type 2 diabetes mellitus with other specified complication: Secondary | ICD-10-CM | POA: Diagnosis not present

## 2020-12-31 DIAGNOSIS — I700 Atherosclerosis of aorta: Secondary | ICD-10-CM

## 2020-12-31 DIAGNOSIS — E1136 Type 2 diabetes mellitus with diabetic cataract: Secondary | ICD-10-CM

## 2020-12-31 MED ORDER — OMEGA 3 1000 MG PO CAPS: 1.0000 | ORAL_CAPSULE | Freq: Every day | ORAL | Status: DC

## 2020-12-31 MED ORDER — TURMERIC 500 MG PO CAPS: 2.0000 | ORAL_CAPSULE | Freq: Every day | ORAL | Status: DC

## 2020-12-31 MED ORDER — SIMVASTATIN 20 MG PO TABS
20.0000 mg | ORAL_TABLET | Freq: Every day | ORAL | 3 refills | Status: DC
Start: 2020-12-31 — End: 2021-11-04

## 2020-12-31 NOTE — Assessment & Plan Note (Signed)
Preventative protocols reviewed and updated unless pt declined. Discussed healthy diet and lifestyle.  

## 2020-12-31 NOTE — Assessment & Plan Note (Signed)
Chronic, stable on simvastatin and low dose fish oil.  The 10-year ASCVD risk score Mikey Bussing DC Brooke Bonito., et al., 2013) is: 22%   Values used to calculate the score:     Age: 75 years     Sex: Male     Is Non-Hispanic African American: Yes     Diabetic: Yes     Tobacco smoker: No     Systolic Blood Pressure: 297 mmHg     Is BP treated: No     HDL Cholesterol: 41.7 mg/dL     Total Cholesterol: 135 mg/dL

## 2020-12-31 NOTE — Assessment & Plan Note (Signed)
Continue statin. 

## 2020-12-31 NOTE — Assessment & Plan Note (Signed)
Discussed lotrimin use.

## 2020-12-31 NOTE — Assessment & Plan Note (Signed)
Chronic, diet controlled. Continue diabetic diet and regular exercise (regular golfing)

## 2020-12-31 NOTE — Progress Notes (Signed)
Patient ID: Edward Lester, male    DOB: Feb 23, 1946, 75 y.o.   MRN: [id number]  This visit was conducted in person.  BP 128/76   Pulse (!) 57   Temp 98 F (36.7 C) (Temporal)   Ht 5\' 9"  (1.753 m)   Wt 192 lb 1 oz (87.1 kg)   SpO2 97%   BMI 28.36 kg/m    CC: CPE Subjective:   HPI: Edward Lester is a 75 y.o. male presenting on 12/31/2020 for Annual Exam   Saw Dr Edward Lester for R knee osteoarthritis - has received synvisc injections with benefit, notes significant improvement with turmeric anti inflammatory. previously tried naprosyn. Was considering Regenix.   DM - underwent DSME 01/2020.   Preventative: COLONOSCOPY WITH PROPOFOL 01/04/2019 - TAx2, HPx1, diverticulosis, rpt 5 yrs Edward Lester, Edward Lester, Edward Lester)  Prostate cancer screening - screen yearly. 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 Nocturia x1, some urgency. No fmhx prostate cancer Lung cancer screening - quit 2005. 30 PY history. Undergoing CT screening, started 2018, last 2020. Flu shot - yearly  COVID vaccine Moderna 01/2020 02/2020 and 10/2020  Tdap 11/2016 Pneumovax - 11/28/2013, prevnar 05/2015 zostavax 2008 shingrix - 12/2018, 05/2019 Advanced directive -scanned1/2018- Edward Lester (wife) then Edward Lester (friend) are HCPOA. Jehova's witness.Ok with CPR and temporary life support but not prolonged if terminal condition. Ok with feeding tube. Seat belt use discussed  Sunscreen use discussed, no changing moles on skin  Ex smoker - quit 2005  Alcohol - social  Dentist q6 mo- recent crown now with toothache - planning to return to dentist  Eye examyearly- monitoring cataract  Bowel - no constipation Bladder - no incontinence   Lives with wife Grown children Jehova's witness Occupation: Printmaker rep - Education officer, [employer]: college  Activity: golfing3x/wk Diet: good water, fruits/vegetables daily- has limited sugared cereal      Relevant past medical, surgical, family and social history reviewed and updated as indicated.  Interim medical history since our last visit reviewed. Allergies and medications reviewed and updated. Outpatient Medications Prior to Visit  Medication Sig Dispense Refill  . tadalafil (CIALIS) 20 MG tablet Take 0.5-1 tablets (10-20 mg total) by mouth every other day as needed for erectile dysfunction. 15 tablet 11  . Omega-3 Fatty Acids (OMEGA 3 PO) Take 1 capsule by mouth 2 (two) times daily.    . simvastatin (ZOCOR) 20 MG tablet      No facility-administered medications prior to visit.     Per HPI unless specifically indicated in ROS section below Review of Systems  Constitutional: Negative for activity change, appetite change, chills, fatigue, fever and unexpected weight change.  HENT: Negative for hearing loss.   Eyes: Negative for visual disturbance.  Respiratory: Negative for cough, chest tightness, shortness of breath and wheezing.   Cardiovascular: Negative for chest pain, palpitations and leg swelling.  Gastrointestinal: Negative for abdominal distention, abdominal pain, blood in stool, constipation, diarrhea, nausea and vomiting.  Genitourinary: Negative for difficulty urinating and hematuria.  Musculoskeletal: Negative for arthralgias, myalgias and neck pain.  Skin: Negative for rash.  Neurological: Negative for dizziness, seizures, syncope and headaches.  Hematological: Negative for adenopathy. Does not bruise/bleed easily.  Psychiatric/Behavioral: Negative for dysphoric mood. The patient is not nervous/anxious.    Objective:  BP 128/76   Pulse (!) 57   Temp 98 F (36.7 C) (Temporal)   Ht 5\' 9"  (1.753 m)   Wt 192 lb 1 oz (87.1 kg)   SpO2 97%   BMI 28.36 kg/m  Wt Readings from Last 3 Encounters:  12/31/20 192 lb 1 oz (87.1 kg)  08/27/20 190 lb 7.6 oz (86.4 kg)  02/01/20 190 lb 8 oz (86.4 kg)      Physical Exam Vitals and nursing note reviewed.  Constitutional:      General: He is not in acute distress.    Appearance: Normal appearance. He is well-developed  and well-nourished. He is not ill-appearing.  HENT:     Head: Normocephalic and atraumatic.     Right Ear: Hearing, tympanic membrane, ear canal and external ear normal.     Left Ear: Hearing, tympanic membrane, ear canal and external ear normal.     Mouth/Throat:     Mouth: Oropharynx is clear and moist and mucous membranes are normal.     Pharynx: No posterior oropharyngeal edema.  Eyes:     General: No scleral icterus.    Extraocular Movements: Extraocular movements intact and EOM normal.     Conjunctiva/sclera: Conjunctivae normal.     Pupils: Pupils are equal, round, and reactive to light.  Neck:     Thyroid: No thyroid mass or thyromegaly.     Vascular: No carotid bruit.  Cardiovascular:     Rate and Rhythm: Normal rate and regular rhythm.     Pulses: Normal pulses and intact distal pulses.          Radial pulses are 2+ on the right side and 2+ on the left side.     Heart sounds: Normal heart sounds. No murmur heard.   Pulmonary:     Effort: Pulmonary effort is normal. No respiratory distress.     Breath sounds: Normal breath sounds. No wheezing, rhonchi or rales.  Abdominal:     General: Abdomen is flat. Bowel sounds are normal. There is no distension.     Palpations: Abdomen is soft. There is no mass.     Tenderness: There is no abdominal tenderness. There is no guarding or rebound.     Hernia: No hernia is present.  Musculoskeletal:        General: No edema. Normal range of motion.     Cervical back: Normal range of motion and neck supple.     Right lower leg: No edema.     Left lower leg: No edema.  Lymphadenopathy:     Cervical: No cervical adenopathy.  Skin:    General: Skin is warm and dry.     Findings: No rash.  Neurological:     General: No focal deficit present.     Mental Status: He is alert and oriented to person, place, and time.     Comments: CN grossly intact, station and gait intact  Psychiatric:        Mood and Affect: Mood and affect and mood  normal.        Behavior: Behavior normal.        Thought Content: Thought content normal.        Judgment: Judgment normal.       Results for orders placed or performed in visit on 12/24/20  PSA  Result Value Ref Range   PSA 0.65 0.10 - 4.00 ng/mL  Hemoglobin A1c  Result Value Ref Range   Hgb A1c MFr Bld 6.7 (H) 4.6 - 6.5 %  Comprehensive metabolic panel  Result Value Ref Range   Sodium 138 135 - 145 mEq/L   Potassium 4.2 3.5 - 5.1 mEq/L   Chloride 107 96 - 112 mEq/L   CO2 25 19 -  32 mEq/L   Glucose, Bld 116 (H) 70 - 99 mg/dL   BUN 14 6 - 23 mg/dL   Creatinine, Ser 1.00 0.40 - 1.50 mg/dL   Total Bilirubin 0.4 0.2 - 1.2 mg/dL   Alkaline Phosphatase 59 39 - 117 U/L   AST 13 0 - 37 U/L   ALT 10 0 - 53 U/L   Total Protein 7.0 6.0 - 8.3 g/dL   Albumin 3.9 3.5 - 5.2 g/dL   GFR 74.69 >60.00 mL/min   Calcium 8.8 8.4 - 10.5 mg/dL  Lipid panel  Result Value Ref Range   Cholesterol 135 0 - 200 mg/dL   Triglycerides 97.0 0.0 - 149.0 mg/dL   HDL 41.70 >39.00 mg/dL   VLDL 19.4 0.0 - 40.0 mg/dL   LDL Cholesterol 74 0 - 99 mg/dL   Total CHOL/HDL Ratio 3    NonHDL 93.17   Microalbumin / creatinine urine ratio  Result Value Ref Range   Microalb, Ur <0.7 0.0 - 1.9 mg/dL   Creatinine,U 96.3 mg/dL   Microalb Creat Ratio 0.7 0.0 - 30.0 mg/g   Assessment & Plan:  This visit occurred during the SARS-CoV-2 public health emergency.  Safety protocols were in place, including screening questions prior to the visit, additional usage of staff PPE, and extensive cleaning of exam room while observing appropriate contact time as indicated for disinfecting solutions.   Problem List Items Addressed This Visit    Tinea pedis    Discussed lotrimin use.       Osteoarthritis of right knee    Stable period on turmeric 1000mg  daily.      Hyperlipidemia associated with type 2 diabetes mellitus (HCC)    Chronic, stable on simvastatin and low dose fish oil.  The 10-year ASCVD risk score Mikey Bussing DC Brooke Bonito.,  et al., 2013) is: 22%   Values used to calculate the score:     Age: 74 years     Sex: Male     Is Non-Hispanic African American: Yes     Diabetic: Yes     Tobacco smoker: No     Systolic Blood Pressure: 409 mmHg     Is BP treated: No     HDL Cholesterol: 41.7 mg/dL     Total Cholesterol: 135 mg/dL       Relevant Medications   simvastatin (ZOCOR) 20 MG tablet   Health care maintenance - Primary    Preventative protocols reviewed and updated unless pt declined. Discussed healthy diet and lifestyle.       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 Continue diabetic diet and regular exercise (regular golfing)      Relevant Medications   simvastatin (ZOCOR) 20 MG tablet   Aortic atherosclerosis (HCC)    Continue statin.       Relevant Medications   simvastatin (ZOCOR) 20 MG tablet    Other Visit Diagnoses    Need for influenza vaccination       Relevant Orders   Flu Vaccine QUAD High Dose(Fluad) (Completed)       Meds ordered this encounter  Medications  . simvastatin (ZOCOR) 20 MG tablet    Sig: Take 1 tablet (20 mg total) by mouth daily at 6 PM.    Dispense:  90 tablet    Refill:  3  . Turmeric 500 MG CAPS    Sig: Take 2 capsules by mouth daily.  . Omega 3 1000 MG CAPS    Sig:  Take 1 capsule (1,000 mg total) by mouth daily.    Dispense:  60 capsule   Orders Placed This Encounter  Procedures  . Flu Vaccine QUAD High Dose(Fluad)    Patient instructions: Flu shot today  You are doing well today Continue watching sugars/carbs in the diet to keep diabetes well controlled.  Return as needed or in 1 year for next wellness physical.   Follow up plan: Return in about 1 year (around 12/31/2021) for annual exam, prior fasting for blood work.  Ria Bush, Edward Lester

## 2020-12-31 NOTE — Patient Instructions (Addendum)
Flu shot today  You are doing well today Continue watching sugars/carbs in the diet to keep diabetes well controlled.  Return as needed or in 1 year for next wellness physical.   Health Maintenance After Age 74 After age 11, you are at a higher risk for certain long-term diseases and infections as well as injuries from falls. Falls are a major cause of broken bones and head injuries in people who are older than age 53. Getting regular preventive care can help to keep you healthy and well. Preventive care includes getting regular testing and making lifestyle changes as recommended by your health care provider. Talk with your health care provider about:  Which screenings and tests you should have. A screening is a test that checks for a disease when you have no symptoms.  A diet and exercise plan that is right for you. What should I know about screenings and tests to prevent falls? Screening and testing are the best ways to find a health problem early. Early diagnosis and treatment give you the best chance of managing medical conditions that are common after age 60. Certain conditions and lifestyle choices may make you more likely to have a fall. Your health care provider may recommend:  Regular vision checks. Poor vision and conditions such as cataracts can make you more likely to have a fall. If you wear glasses, make sure to get your prescription updated if your vision changes.  Medicine review. Work with your health care provider to regularly review all of the medicines you are taking, including over-the-counter medicines. Ask your health care provider about any side effects that may make you more likely to have a fall. Tell your health care provider if any medicines that you take make you feel dizzy or sleepy.  Osteoporosis screening. Osteoporosis is a condition that causes the bones to get weaker. This can make the bones weak and cause them to break more easily.  Blood pressure screening.  Blood pressure changes and medicines to control blood pressure can make you feel dizzy.  Strength and balance checks. Your health care provider may recommend certain tests to check your strength and balance while standing, walking, or changing positions.  Foot health exam. Foot pain and numbness, as well as not wearing proper footwear, can make you more likely to have a fall.  Depression screening. You may be more likely to have a fall if you have a fear of falling, feel emotionally low, or feel unable to do activities that you used to do.  Alcohol use screening. Using too much alcohol can affect your balance and may make you more likely to have a fall. What actions can I take to lower my risk of falls? General instructions  Talk with your health care provider about your risks for falling. Tell your health care provider if: ? You fall. Be sure to tell your health care provider about all falls, even ones that seem minor. ? You feel dizzy, sleepy, or off-balance.  Take over-the-counter and prescription medicines only as told by your health care provider. These include any supplements.  Eat a healthy diet and maintain a healthy weight. A healthy diet includes low-fat dairy products, low-fat (lean) meats, and fiber from whole grains, beans, and lots of fruits and vegetables. Home safety  Remove any tripping hazards, such as rugs, cords, and clutter.  Install safety equipment such as grab bars in bathrooms and safety rails on stairs.  Keep rooms and walkways well-lit. Activity  Follow a  regular exercise program to stay fit. This will help you maintain your balance. Ask your health care provider what types of exercise are appropriate for you.  If you need a cane or walker, use it as recommended by your health care provider.  Wear supportive shoes that have nonskid soles.   Lifestyle  Do not drink alcohol if your health care provider tells you not to drink.  If you drink alcohol, limit  how much you have: ? 0-1 drink a day for women. ? 0-2 drinks a day for men.  Be aware of how much alcohol is in your drink. In the [place] one drink equals one typical bottle of beer (12 oz), one-half glass of wine (5 oz), or one shot of hard liquor (1 oz).  Do not use any products that contain nicotine or tobacco, such as cigarettes and e-cigarettes. If you need help quitting, ask your health care provider. Summary  Having a healthy lifestyle and getting preventive care can help to protect your health and wellness after age 29.  Screening and testing are the best way to find a health problem early and help you avoid having a fall. Early diagnosis and treatment give you the best chance for managing medical conditions that are more common for people who are older than age 70.  Falls are a major cause of broken bones and head injuries in people who are older than age 20. Take precautions to prevent a fall at home.  Work with your health care provider to learn what changes you can make to improve your health and wellness and to prevent falls. This information is not intended to replace advice given to you by your health care provider. Make sure you discuss any questions you have with your health care provider. Document Revised: 02/17/2019 Document Reviewed: 09/09/2017 Elsevier Patient Education  2021 [employer].

## 2020-12-31 NOTE — Assessment & Plan Note (Signed)
Stable period on turmeric 1000mg  daily.

## 2021-03-14 ENCOUNTER — Encounter: Payer: Self-pay | Admitting: Family Medicine

## 2021-03-14 NOTE — Telephone Encounter (Signed)
He wants to talk to you about knee replacement.

## 2021-03-14 NOTE — Telephone Encounter (Signed)
I need to clarify.  Does he want to come in to talk to me about knee arthritis?   Or does he want a recommendation for a total joint surgeon?

## 2021-03-14 NOTE — Telephone Encounter (Addendum)
Left message for Edward Lester to return my call.  Need clarification about what he is wanting to discuss with Dr. Lorelei Pont.  Does he want to come in and discuss a knee replacement or is he just wanting a referral to an orthopedic surgeon for knee replacement?

## 2021-03-14 NOTE — Telephone Encounter (Signed)
Please speak to him on the phone

## 2021-03-14 NOTE — Telephone Encounter (Signed)
Last saw [facility] for knee osteoarthritis s/p synvisc 2019 - will forward to [facility].

## 2021-03-15 ENCOUNTER — Telehealth: Payer: Self-pay | Admitting: Family Medicine

## 2021-03-15 NOTE — Telephone Encounter (Signed)
Noted  

## 2021-03-15 NOTE — Telephone Encounter (Signed)
Spoke with patient schedule appointment to discuss knee arthritis.

## 2021-03-15 NOTE — Telephone Encounter (Signed)
[name],   Can we schedule a non-urgent appointment to discuss knee arthritis.   Next Wed. If possible  Thanks!

## 2021-03-19 ENCOUNTER — Encounter: Payer: Self-pay | Admitting: Family Medicine

## 2021-03-20 MED ORDER — TADALAFIL 20 MG PO TABS: 10.0000 mg | ORAL_TABLET | ORAL | 9 refills | Status: DC | PRN

## 2021-03-20 NOTE — Telephone Encounter (Signed)
E-scribed refill 

## 2021-03-24 NOTE — Progress Notes (Signed)
Edward Gladu T. Clanton Emanuelson, MD, [place]  [facility] and [place] at [facility] [place], [address]  Phone: [phone]  FAX: [place] - 75 y.o. male  MRN [id number]  Date of Birth: [birth year]  Date: 03/25/2021  PCP: Ria Bush, MD  Referral: Ria Bush, MD  Chief Complaint  Patient presents with  . Arthritis    Rt Knee    This visit occurred during the SARS-CoV-2 public health emergency.  Safety protocols were in place, including screening questions prior to the visit, additional usage of staff PPE, and extensive cleaning of exam room while observing appropriate contact time as indicated for disinfecting solutions.   Subjective:   Edward Lester is a 75 y.o. very pleasant male patient with Body mass index is 27.47 kg/m. who presents with the following:  He is regarding his R knee arthritis.  The last time I saw him, I did a Synvic-One injection of the right knee with good results.   On his last plain knee XR on the R, he did have advanced arthritis on the R.  Has had some shifting in his knee with his golf swing.  - sells optometry equipment  Right now, he is not having any pain in his knee but he does have some loss of motion.  He has having some intermittent effusions, and he does ice after playing golf. He is able to play 9 holes of golf with walking, and he does play 18 holes of golf when he uses a cart.  Review of Systems is noted in the HPI, as appropriate   Objective:   BP 104/76   Pulse 75   Temp 97.6 F (36.4 C) (Temporal)   Ht 5\' 9"  (1.753 m)   Wt 186 lb (84.4 kg)   SpO2 96%   BMI 27.47 kg/m   GEN: No acute distress; alert,appropriate. PULM: Breathing comfortably in no respiratory distress PSYCH: Normally interactive.    Right knee: The patient lacks 5 degrees of extension and he is able to flex his knee to approximately 100 degrees.  He does have a  moderate effusion.  Stable to varus and valgus stress.  ACL and PCL are intact.  Good quadriceps musculature.  Forced flexion does cause pain in any way, but this is relatively mild.  Radiology: No results found.  Assessment and Plan:     ICD-10-CM   1. Primary osteoarthritis of right knee  M17.11 DG Knee 4 Views W/Patella Right   Weightbearing knee films today do show some progression of arthritis which is end-stage tricompartmental arthritis.  There is a new loose body in the suprapatellar recess, and based on appearance of the patella, likely comes from the caudal patella.  For now, he is not in much pain so I recommended that he keep doing with his activity.  He does have some pain and stiffness in the morning, and I did recommend that he ice his knee after being active and playing golf.  I think that he can watch this and he has minimal functional impairment despite severe knee arthritis on plain x-ray.  He can follow-up with me on a as needed  Orders Placed This Encounter  Procedures  . DG Knee 4 Views W/Patella Right   Signed,  Frederico Hamman T. Marck Mcclenny, MD   Outpatient Encounter Medications as of 03/25/2021  Medication Sig  . Omega 3 1000 MG CAPS Take 1 capsule (1,000 mg total) by mouth  daily.  . simvastatin (ZOCOR) 20 MG tablet Take 1 tablet (20 mg total) by mouth daily at 6 PM.  . tadalafil (CIALIS) 20 MG tablet Take 0.5-1 tablets (10-20 mg total) by mouth every other day as needed for erectile dysfunction.   No facility-administered encounter medications on file as of 03/25/2021.

## 2021-03-25 ENCOUNTER — Other Ambulatory Visit: Payer: Self-pay

## 2021-03-25 ENCOUNTER — Encounter: Payer: Self-pay | Admitting: Family Medicine

## 2021-03-25 ENCOUNTER — Ambulatory Visit (INDEPENDENT_AMBULATORY_CARE_PROVIDER_SITE_OTHER)
Admission: RE | Admit: 2021-03-25 | Discharge: 2021-03-25 | Disposition: A | Discharge status: Home / Self Care | Payer: BC Managed Care – PPO | Source: Ambulatory Visit | Attending: Family Medicine | Admitting: Family Medicine

## 2021-03-25 ENCOUNTER — Ambulatory Visit: Payer: BC Managed Care – PPO | Admitting: Family Medicine

## 2021-03-25 VITALS — BP 104/76 | HR 75 | Temp 97.6°F | Ht 69.0 in | Wt 186.0 lb

## 2021-03-25 DIAGNOSIS — M1711 Unilateral primary osteoarthritis, right knee: Secondary | ICD-10-CM

## 2021-04-22 ENCOUNTER — Other Ambulatory Visit: Payer: Self-pay | Admitting: Family Medicine

## 2021-04-22 DIAGNOSIS — H524 Presbyopia: Secondary | ICD-10-CM | POA: Diagnosis not present

## 2021-04-22 DIAGNOSIS — H2513 Age-related nuclear cataract, bilateral: Secondary | ICD-10-CM | POA: Diagnosis not present

## 2021-04-22 DIAGNOSIS — E119 Type 2 diabetes mellitus without complications: Secondary | ICD-10-CM | POA: Diagnosis not present

## 2021-04-22 LAB — HM DIABETES EYE EXAM

## 2021-04-23 ENCOUNTER — Encounter: Payer: Self-pay | Admitting: Family Medicine

## 2021-05-15 ENCOUNTER — Other Ambulatory Visit: Payer: Self-pay | Admitting: Family Medicine

## 2021-05-16 NOTE — Telephone Encounter (Signed)
Naproxen Last filled:  04/25/21, #40 Last OV:  12/31/20, CPE Next OV:  none

## 2021-11-01 ENCOUNTER — Other Ambulatory Visit: Payer: Self-pay | Admitting: Family Medicine

## 2021-12-19 ENCOUNTER — Other Ambulatory Visit: Payer: Self-pay | Admitting: Family Medicine

## 2021-12-19 DIAGNOSIS — E1169 Type 2 diabetes mellitus with other specified complication: Secondary | ICD-10-CM

## 2021-12-19 DIAGNOSIS — Z125 Encounter for screening for malignant neoplasm of prostate: Secondary | ICD-10-CM

## 2021-12-19 DIAGNOSIS — E1136 Type 2 diabetes mellitus with diabetic cataract: Secondary | ICD-10-CM

## 2021-12-31 ENCOUNTER — Other Ambulatory Visit: Payer: Self-pay

## 2021-12-31 ENCOUNTER — Other Ambulatory Visit (INDEPENDENT_AMBULATORY_CARE_PROVIDER_SITE_OTHER): Payer: Medicare (Managed Care)

## 2021-12-31 DIAGNOSIS — E785 Hyperlipidemia, unspecified: Secondary | ICD-10-CM

## 2021-12-31 DIAGNOSIS — E1136 Type 2 diabetes mellitus with diabetic cataract: Secondary | ICD-10-CM

## 2021-12-31 DIAGNOSIS — Z125 Encounter for screening for malignant neoplasm of prostate: Secondary | ICD-10-CM | POA: Diagnosis not present

## 2021-12-31 DIAGNOSIS — E1169 Type 2 diabetes mellitus with other specified complication: Secondary | ICD-10-CM | POA: Diagnosis not present

## 2021-12-31 LAB — LIPID PANEL
Cholesterol: 130 mg/dL (ref 0–200)
HDL: 41.7 mg/dL (ref >39.00)
LDL Cholesterol: 72 mg/dL (ref 0–99)
NonHDL: 88.5
Total CHOL/HDL Ratio: 3
Triglycerides: 82 mg/dL (ref 0.0–149.0)
VLDL: 16.4 mg/dL (ref 0.0–40.0)

## 2021-12-31 LAB — MICROALBUMIN / CREATININE URINE RATIO
Creatinine,U: 190 mg/dL
Microalb Creat Ratio: 0.8 mg/g (ref 0.0–30.0)
Microalb, Ur: 1.6 mg/dL (ref 0.0–1.9)

## 2021-12-31 LAB — COMPREHENSIVE METABOLIC PANEL
ALT: 10 U/L (ref 0–53)
AST: 17 U/L (ref 0–37)
Albumin: 4.1 g/dL (ref 3.5–5.2)
Alkaline Phosphatase: 66 U/L (ref 39–117)
BUN: 15 mg/dL (ref 6–23)
CO2: 28 mEq/L (ref 19–32)
Calcium: 8.8 mg/dL (ref 8.4–10.5)
Chloride: 107 mEq/L (ref 96–112)
Creatinine, Ser: 1.08 mg/dL (ref 0.40–1.50)
GFR: 67.62 mL/min (ref >60.00)
Glucose, Bld: 135 mg/dL — ABNORMAL HIGH (ref 70–99)
Potassium: 4.2 mEq/L (ref 3.5–5.1)
Sodium: 138 mEq/L (ref 135–145)
Total Bilirubin: 0.6 mg/dL (ref 0.2–1.2)
Total Protein: 7.3 g/dL (ref 6.0–8.3)

## 2021-12-31 LAB — HEMOGLOBIN A1C: Hgb A1c MFr Bld: 6.9 % — ABNORMAL HIGH (ref 4.6–6.5)

## 2021-12-31 LAB — PSA, MEDICARE: PSA: 1.02 ng/ml (ref 0.10–4.00)

## 2022-01-07 ENCOUNTER — Other Ambulatory Visit: Payer: Self-pay | Admitting: Family Medicine

## 2022-01-07 ENCOUNTER — Encounter: Payer: Self-pay | Admitting: Family Medicine

## 2022-01-07 ENCOUNTER — Ambulatory Visit (INDEPENDENT_AMBULATORY_CARE_PROVIDER_SITE_OTHER): Payer: Medicare (Managed Care) | Admitting: Family Medicine

## 2022-01-07 ENCOUNTER — Other Ambulatory Visit: Payer: Self-pay

## 2022-01-07 VITALS — BP 136/80 | HR 66 | Temp 97.7°F | Ht 69.0 in | Wt 190.0 lb

## 2022-01-07 DIAGNOSIS — Z136 Encounter for screening for cardiovascular disorders: Secondary | ICD-10-CM | POA: Diagnosis not present

## 2022-01-07 DIAGNOSIS — I2510 Atherosclerotic heart disease of native coronary artery without angina pectoris: Secondary | ICD-10-CM

## 2022-01-07 DIAGNOSIS — I700 Atherosclerosis of aorta: Secondary | ICD-10-CM

## 2022-01-07 DIAGNOSIS — E1169 Type 2 diabetes mellitus with other specified complication: Secondary | ICD-10-CM | POA: Diagnosis not present

## 2022-01-07 DIAGNOSIS — E1136 Type 2 diabetes mellitus with diabetic cataract: Secondary | ICD-10-CM | POA: Diagnosis not present

## 2022-01-07 DIAGNOSIS — N529 Male erectile dysfunction, unspecified: Secondary | ICD-10-CM

## 2022-01-07 DIAGNOSIS — E785 Hyperlipidemia, unspecified: Secondary | ICD-10-CM

## 2022-01-07 DIAGNOSIS — Z23 Encounter for immunization: Secondary | ICD-10-CM

## 2022-01-07 DIAGNOSIS — J432 Centrilobular emphysema: Secondary | ICD-10-CM

## 2022-01-07 DIAGNOSIS — Z87891 Personal history of nicotine dependence: Secondary | ICD-10-CM

## 2022-01-07 DIAGNOSIS — Z0000 Encounter for general adult medical examination without abnormal findings: Secondary | ICD-10-CM

## 2022-01-07 DIAGNOSIS — Z7189 Other specified counseling: Secondary | ICD-10-CM | POA: Diagnosis not present

## 2022-01-07 DIAGNOSIS — I2584 Coronary atherosclerosis due to calcified coronary lesion: Secondary | ICD-10-CM

## 2022-01-07 MED ORDER — SIMVASTATIN 20 MG PO TABS: 20.0000 mg | ORAL_TABLET | Freq: Every day | ORAL | 3 refills | Status: DC

## 2022-01-07 MED ORDER — TADALAFIL 20 MG PO TABS: 10.0000 mg | ORAL_TABLET | ORAL | 6 refills | Status: DC | PRN

## 2022-01-07 NOTE — Assessment & Plan Note (Signed)

## 2022-01-07 NOTE — Progress Notes (Signed)
Patient ID: Hermilo Dutter, male    DOB: Jan 21, 1946, 76 y.o.   MRN: [id number]  This visit was conducted in person.  BP 136/80    Pulse 66    Temp 97.7 F (36.5 C) (Temporal)    Ht 5\' 9"  (1.753 m)    Wt 190 lb (86.2 kg)    SpO2 95%    BMI 28.06 kg/m    CC: Welcome to medicare visit   Subjective:   HPI: Ramonte Mena is a 76 y.o. male presenting on 01/07/2022 for Medicare Wellness   Fully retired 11/2021.   Hearing Screening   500Hz  1000Hz  2000Hz  4000Hz   Right ear 20 20 20 20   Left ear 20 20 20  0  Vision Screening - Comments:: Last eye exam, 03/2021.  [place] Office Visit from 01/07/2022 in [facility] [place]  PHQ-2 Total Score 0       Fall Risk  01/07/2022 03/25/2021 02/01/2020 01/17/2020  Falls in the past year? 0 0 0 0    DM - diet controlled.   Preventative: COLONOSCOPY WITH PROPOFOL 01/04/2019 - TAx2, HPx1, diverticulosis, rpt 5 yrs [name], [name], MD)  Prostate cancer screening - screen yearly. 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 Nocturia x1-2. No fmhx prostate cancer.  Lung cancer screening - quit 2005. 30 PY history. Last lung cancer CT scan was 2020.  AAA screen - discussed Flu shot - yearly  COVID vaccine Moderna 01/2020, 02/2020, booster 10/2020  Tdap 11/2016  Pneumovax - 11/28/2013, YNWGNFA-21 05/2015 zostavax 2008 shingrix - 12/2018, 05/2019 Advanced directive - scanned 11/2016 Delcie Roch (wife) then Thurnell Garbe (friend) are HCPOA. Jehova's witness. Ok with CPR and temporary life support but not prolonged if terminal condition. Ok with feeding tube.  Seat belt use discussed  Sunscreen use discussed, no changing moles on skin  Ex smoker - quit 2005  Alcohol - occasional Dentist q6 mo  Eye exam yearly (Dr Valetta Close) - monitoring cataract  Bowel - no constipation Bladder - no incontinence   Lives with wife Grown children Jehova's witness Occupation: Printmaker rep - Education officer, [employer] Edu: college  Activity: golfing 3x/wk  Diet: good water,  fruits/vegetables daily - has limited sugared cereal      Relevant past medical, surgical, family and social history reviewed and updated as indicated. Interim medical history since our last visit reviewed. Allergies and medications reviewed and updated. Outpatient Medications Prior to Visit  Medication Sig Dispense Refill   simvastatin (ZOCOR) 20 MG tablet TAKE 1 TABLET BY MOUTH  DAILY AT 6 PM. 90 tablet 1   tadalafil (CIALIS) 20 MG tablet Take 0.5-1 tablets (10-20 mg total) by mouth every other day as needed for erectile dysfunction. 15 tablet 9   naproxen (NAPROSYN) 500 MG tablet TAKE 1 TABLET (500 MG TOTAL) BY MOUTH 2 (TWO) TIMES DAILY WITH A MEAL IF NEEDED 40 tablet 0   Omega 3 1000 MG CAPS Take 1 capsule (1,000 mg total) by mouth daily. 60 capsule    No facility-administered medications prior to visit.     Per HPI unless specifically indicated in ROS section below Review of Systems  Objective:  BP 136/80    Pulse 66    Temp 97.7 F (36.5 C) (Temporal)    Ht 5\' 9"  (1.753 m)    Wt 190 lb (86.2 kg)    SpO2 95%    BMI 28.06 kg/m   Wt Readings from Last 3 Encounters:  01/07/22 190 lb (86.2 kg)  03/25/21 186 lb (  84.4 kg)  12/31/20 192 lb 1 oz (87.1 kg)      Physical Exam Vitals and nursing note reviewed.  Constitutional:      General: He is not in acute distress.    Appearance: Normal appearance. He is well-developed. He is not ill-appearing.  HENT:     Head: Normocephalic and atraumatic.     Right Ear: Hearing, tympanic membrane, ear canal and external ear normal.     Left Ear: Hearing, tympanic membrane, ear canal and external ear normal.  Eyes:     General: No scleral icterus.    Extraocular Movements: Extraocular movements intact.     Conjunctiva/sclera: Conjunctivae normal.     Pupils: Pupils are equal, round, and reactive to light.  Neck:     Thyroid: No thyroid mass or thyromegaly.     Vascular: No carotid bruit.  Cardiovascular:     Rate and Rhythm: Normal rate  and regular rhythm.     Pulses: Normal pulses.          Radial pulses are 2+ on the right side and 2+ on the left side.     Heart sounds: Normal heart sounds. No murmur heard. Pulmonary:     Effort: Pulmonary effort is normal. No respiratory distress.     Breath sounds: Normal breath sounds. No wheezing, rhonchi or rales.  Abdominal:     General: Bowel sounds are normal. There is no distension.     Palpations: Abdomen is soft. There is no mass.     Tenderness: There is no abdominal tenderness. There is no guarding or rebound.     Hernia: No hernia is present.  Musculoskeletal:        General: Normal range of motion.     Cervical back: Normal range of motion and neck supple.     Right lower leg: No edema.     Left lower leg: No edema.     Comments: See below for foot exam  Lymphadenopathy:     Cervical: No cervical adenopathy.  Skin:    General: Skin is warm and dry.     Findings: No rash.  Neurological:     General: No focal deficit present.     Mental Status: He is alert and oriented to person, place, and time.  Psychiatric:        Mood and Affect: Mood normal.        Behavior: Behavior normal.        Thought Content: Thought content normal.        Judgment: Judgment normal.   Diabetic Foot Exam - Simple   Simple Foot Form Diabetic Foot exam was performed with the following findings: Yes 01/07/2022  2:40 PM  Visual Inspection See comments: Yes Sensation Testing Intact to touch and monofilament testing bilaterally: Yes Pulse Check Posterior Tibialis and Dorsalis pulse intact bilaterally: Yes Comments Maceration between toes on left 3rd/4th and 4th/5th         Results for orders placed or performed in visit on 12/31/21  PSA, Medicare  Result Value Ref Range   PSA 1.02 0.10 - 4.00 ng/ml  Microalbumin / creatinine urine ratio  Result Value Ref Range   Microalb, Ur 1.6 0.0 - 1.9 mg/dL   Creatinine,U 190.0 mg/dL   Microalb Creat Ratio 0.8 0.0 - 30.0 mg/g  Hemoglobin  A1c  Result Value Ref Range   Hgb A1c MFr Bld 6.9 (H) 4.6 - 6.5 %  Comprehensive metabolic panel  Result Value Ref Range  Sodium 138 135 - 145 mEq/L   Potassium 4.2 3.5 - 5.1 mEq/L   Chloride 107 96 - 112 mEq/L   CO2 28 19 - 32 mEq/L   Glucose, Bld 135 (H) 70 - 99 mg/dL   BUN 15 6 - 23 mg/dL   Creatinine, Ser 1.08 0.40 - 1.50 mg/dL   Total Bilirubin 0.6 0.2 - 1.2 mg/dL   Alkaline Phosphatase 66 39 - 117 U/L   AST 17 0 - 37 U/L   ALT 10 0 - 53 U/L   Total Protein 7.3 6.0 - 8.3 g/dL   Albumin 4.1 3.5 - 5.2 g/dL   GFR 67.62 >60.00 mL/min   Calcium 8.8 8.4 - 10.5 mg/dL  Lipid panel  Result Value Ref Range   Cholesterol 130 0 - 200 mg/dL   Triglycerides 82.0 0.0 - 149.0 mg/dL   HDL 41.70 >39.00 mg/dL   VLDL 16.4 0.0 - 40.0 mg/dL   LDL Cholesterol 72 0 - 99 mg/dL   Total CHOL/HDL Ratio 3    NonHDL 88.50    EKG - sinus bradycardia 50s, normal axis, intervals, no hypertrophy or acute ST/T changes  Assessment & Plan:  This visit occurred during the SARS-CoV-2 public health emergency.  Safety protocols were in place, including screening questions prior to the visit, additional usage of staff PPE, and extensive cleaning of exam room while observing appropriate contact time as indicated for disinfecting solutions.   Problem List Items Addressed This Visit     Advanced care planning/counseling discussion (Chronic)    Advanced directive - scanned 11/2016 Delcie Roch (wife) then Thurnell Garbe (friend) are HCPOA. Jehova's witness. Ok with CPR and temporary life support but not prolonged if terminal condition. Ok with feeding tube.       Welcome to Medicare preventive visit - Primary (Chronic)    I have personally reviewed the Medicare Annual Wellness questionnaire and have noted 1. The patient's medical and social history 2. Their use of alcohol, tobacco or illicit drugs 3. Their current medications and supplements 4. The patient's functional ability including ADL's, fall risks, home safety  risks and hearing or visual impairment. Cognitive function has been assessed and addressed as indicated.  5. Diet and physical activity 6. Evidence for depression or mood disorders The patients weight, height, BMI have been recorded in the chart. I have made referrals, counseling and provided education to the patient based on review of the above and I have provided the pt with a written personalized care plan for preventive services. Provider list updated.. See scanned questionairre as needed for further documentation. Reviewed preventative protocols and updated unless pt declined.       Relevant Orders   EKG 12-Lead (Completed)   [place] AORTA MEDICARE SCREENING   Hyperlipidemia associated with type 2 diabetes mellitus (HCC)    Chronic, stable period on simvastatin - continue. The 10-year ASCVD risk score (Arnett DK, et al., 2019) is: 24.7%   Values used to calculate the score:     Age: 16 years     Sex: Male     Is Non-Hispanic African American: Yes     Diabetic: Yes     Tobacco smoker: No     Systolic Blood Pressure: 960 mmHg     Is BP treated: No     HDL Cholesterol: 41.7 mg/dL     Total Cholesterol: 130 mg/dL       Relevant Medications   simvastatin (ZOCOR) 20 MG tablet   tadalafil (CIALIS) 20 MG tablet  ED (erectile dysfunction) of organic origin    cialis effective - refilled.       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 Foot exam today.       Relevant Medications   simvastatin (ZOCOR) 20 MG tablet   Ex-smoker    Will order AAA screen in smoking history (30+ Y hx), quit 2005.       Relevant Orders   [place] AORTA MEDICARE SCREENING   Aortic atherosclerosis (HCC)    Continue statin.       Relevant Medications   simvastatin (ZOCOR) 20 MG tablet   tadalafil (CIALIS) 20 MG tablet   Coronary artery calcification    Continue statin.       Relevant Medications   simvastatin (ZOCOR) 20 MG tablet   tadalafil  (CIALIS) 20 MG tablet   Emphysema lung (HCC)    Noted incidentally on CT, pt asxs. Ex smoker.       Other Visit Diagnoses     Need for influenza vaccination       Relevant Orders   Flu Vaccine QUAD High Dose(Fluad) (Completed)   Screening for AAA (abdominal aortic aneurysm)       Relevant Orders   [place] AORTA MEDICARE SCREENING        Meds ordered this encounter  Medications   simvastatin (ZOCOR) 20 MG tablet    Sig: Take 1 tablet (20 mg total) by mouth daily at 6 PM.    Dispense:  90 tablet    Refill:  3   tadalafil (CIALIS) 20 MG tablet    Sig: Take 0.5-1 tablets (10-20 mg total) by mouth every other day as needed for erectile dysfunction.    Dispense:  15 tablet    Refill:  6   Orders Placed This Encounter  Procedures   US AORTA MEDICARE SCREENING    Standing Status:   Future    Standing Expiration Date:   01/10/2023    Order Specific Question:   Reason for Exam (SYMPTOM  OR DIAGNOSIS REQUIRED)    Answer:   AAA screen in smoking history    Order Specific Question:   Preferred imaging location?    Answer:   [facility]   Flu Vaccine QUAD High Dose(Fluad)   EKG 12-Lead    Patient instructions: Flu shot today  EKG today  We will refer you for abdominal aneurysm screen.  You are doing well today Return as needed or in 1 year for next wellness visit.   Follow up plan: No follow-ups on file.  Ria Bush, MD

## 2022-01-07 NOTE — Patient Instructions (Addendum)
Flu shot today  EKG today  We will refer you for abdominal aneurysm screen.  You are doing well today Return as needed or in 1 year for next wellness visit.   Health Maintenance After Age 75 After age 17, you are at a higher risk for certain long-term diseases and infections as well as injuries from falls. Falls are a major cause of broken bones and head injuries in people who are older than age 48. Getting regular preventive care can help to keep you healthy and well. Preventive care includes getting regular testing and making lifestyle changes as recommended by your health care provider. Talk with your health care provider about: Which screenings and tests you should have. A screening is a test that checks for a disease when you have no symptoms. A diet and exercise plan that is right for you. What should I know about screenings and tests to prevent falls? Screening and testing are the best ways to find a health problem early. Early diagnosis and treatment give you the best chance of managing medical conditions that are common after age 72. Certain conditions and lifestyle choices may make you more likely to have a fall. Your health care provider may recommend: Regular vision checks. Poor vision and conditions such as cataracts can make you more likely to have a fall. If you wear glasses, make sure to get your prescription updated if your vision changes. Medicine review. Work with your health care provider to regularly review all of the medicines you are taking, including over-the-counter medicines. Ask your health care provider about any side effects that may make you more likely to have a fall. Tell your health care provider if any medicines that you take make you feel dizzy or sleepy. Strength and balance checks. Your health care provider may recommend certain tests to check your strength and balance while standing, walking, or changing positions. Foot health exam. Foot pain and numbness, as well  as not wearing proper footwear, can make you more likely to have a fall. Screenings, including: Osteoporosis screening. Osteoporosis is a condition that causes the bones to get weaker and break more easily. Blood pressure screening. Blood pressure changes and medicines to control blood pressure can make you feel dizzy. Depression screening. You may be more likely to have a fall if you have a fear of falling, feel depressed, or feel unable to do activities that you used to do. Alcohol use screening. Using too much alcohol can affect your balance and may make you more likely to have a fall. Follow these instructions at home: Lifestyle Do not drink alcohol if: Your health care provider tells you not to drink. If you drink alcohol: Limit how much you have to: 0-1 drink a day for women. 0-2 drinks a day for men. Know how much alcohol is in your drink. In the [place] one drink equals one 12 oz bottle of beer (355 mL), one 5 oz glass of wine (148 mL), or one 1 oz glass of hard liquor (44 mL). Do not use any products that contain nicotine or tobacco. These products include cigarettes, chewing tobacco, and vaping devices, such as e-cigarettes. If you need help quitting, ask your health care provider. Activity  Follow a regular exercise program to stay fit. This will help you maintain your balance. Ask your health care provider what types of exercise are appropriate for you. If you need a cane or walker, use it as recommended by your health care provider. Wear supportive shoes  that have nonskid soles. Safety  Remove any tripping hazards, such as rugs, cords, and clutter. Install safety equipment such as grab bars in bathrooms and safety rails on stairs. Keep rooms and walkways well-lit. General instructions Talk with your health care provider about your risks for falling. Tell your health care provider if: You fall. Be sure to tell your health care provider about all falls, even ones that seem  minor. You feel dizzy, tiredness (fatigue), or off-balance. Take over-the-counter and prescription medicines only as told by your health care provider. These include supplements. Eat a healthy diet and maintain a healthy weight. A healthy diet includes low-fat dairy products, low-fat (lean) meats, and fiber from whole grains, beans, and lots of fruits and vegetables. Stay current with your vaccines. Schedule regular health, dental, and eye exams. Summary Having a healthy lifestyle and getting preventive care can help to protect your health and wellness after age 66. Screening and testing are the best way to find a health problem early and help you avoid having a fall. Early diagnosis and treatment give you the best chance for managing medical conditions that are more common for people who are older than age 39. Falls are a major cause of broken bones and head injuries in people who are older than age 58. Take precautions to prevent a fall at home. Work with your health care provider to learn what changes you can make to improve your health and wellness and to prevent falls. This information is not intended to replace advice given to you by your health care provider. Make sure you discuss any questions you have with your health care provider. Document Revised: 03/18/2021 Document Reviewed: 03/18/2021 Elsevier Patient Education  [place].

## 2022-01-09 ENCOUNTER — Encounter: Payer: Self-pay | Admitting: *Deleted

## 2022-01-09 NOTE — Assessment & Plan Note (Signed)
Chronic, stable period on simvastatin - continue. ?The 10-year ASCVD risk score (Arnett DK, et al., 2019) is: 24.7% ?  Values used to calculate the score: ?    Age: 76 years ?    Sex: Male ?    Is Non-Hispanic African American: Yes ?    Diabetic: Yes ?    Tobacco smoker: No ?    Systolic Blood Pressure: 161 mmHg ?    Is BP treated: No ?    HDL Cholesterol: 41.7 mg/dL ?    Total Cholesterol: 130 mg/dL  ?

## 2022-01-09 NOTE — Assessment & Plan Note (Signed)
Advanced directive - scanned 11/2016 Delcie Roch (wife) then Thurnell Garbe (friend) are HCPOA. Jehova's witness. Ok with CPR and temporary life support but not prolonged if terminal condition. Ok with feeding tube.  ?

## 2022-01-09 NOTE — Assessment & Plan Note (Signed)
Continue statin. 

## 2022-01-09 NOTE — Assessment & Plan Note (Signed)
Chronic, stable, diet controlled. Foot exam today.  ?

## 2022-01-09 NOTE — Assessment & Plan Note (Signed)
cialis effective - refilled.  ?

## 2022-01-09 NOTE — Assessment & Plan Note (Signed)
Will order AAA screen in smoking history (30+ Y hx), quit 2005.  ?

## 2022-01-09 NOTE — Assessment & Plan Note (Signed)
Noted incidentally on CT, pt asxs. Ex smoker.  ?

## 2022-02-05 ENCOUNTER — Ambulatory Visit
Admission: RE | Admit: 2022-02-05 | Discharge: 2022-02-05 | Disposition: A | Discharge status: Home / Self Care | Payer: Medicare (Managed Care) | Source: Ambulatory Visit | Attending: Family Medicine | Admitting: Family Medicine

## 2022-02-05 ENCOUNTER — Other Ambulatory Visit: Payer: Self-pay

## 2022-02-05 DIAGNOSIS — Z136 Encounter for screening for cardiovascular disorders: Secondary | ICD-10-CM | POA: Diagnosis not present

## 2022-02-05 DIAGNOSIS — Z0000 Encounter for general adult medical examination without abnormal findings: Secondary | ICD-10-CM | POA: Diagnosis not present

## 2022-02-05 DIAGNOSIS — Z87891 Personal history of nicotine dependence: Secondary | ICD-10-CM | POA: Insufficient documentation

## 2022-04-29 LAB — HM DIABETES EYE EXAM

## 2022-05-05 ENCOUNTER — Encounter: Payer: Self-pay | Admitting: Family Medicine

## 2022-05-28 DIAGNOSIS — Z0000 Encounter for general adult medical examination without abnormal findings: Secondary | ICD-10-CM | POA: Diagnosis not present

## 2022-06-26 DIAGNOSIS — H25813 Combined forms of age-related cataract, bilateral: Secondary | ICD-10-CM | POA: Diagnosis not present

## 2022-07-04 DIAGNOSIS — M545 Low back pain, unspecified: Secondary | ICD-10-CM | POA: Diagnosis not present

## 2022-07-08 ENCOUNTER — Encounter: Payer: Self-pay | Admitting: Family Medicine

## 2022-07-08 ENCOUNTER — Ambulatory Visit (INDEPENDENT_AMBULATORY_CARE_PROVIDER_SITE_OTHER): Payer: Medicare (Managed Care) | Admitting: Family Medicine

## 2022-07-08 VITALS — BP 152/80 | HR 61 | Temp 97.9°F | Ht 69.0 in | Wt 194.4 lb

## 2022-07-08 DIAGNOSIS — M545 Low back pain, unspecified: Secondary | ICD-10-CM | POA: Diagnosis not present

## 2022-07-08 DIAGNOSIS — R030 Elevated blood-pressure reading, without diagnosis of hypertension: Secondary | ICD-10-CM | POA: Diagnosis not present

## 2022-07-08 DIAGNOSIS — E1136 Type 2 diabetes mellitus with diabetic cataract: Secondary | ICD-10-CM

## 2022-07-08 NOTE — Assessment & Plan Note (Signed)
BP elevation noted today, in setting of naprosyn '500mg'$  use for the past week. He doesn't have history of hypertension. Advised drop naprosyn max to '220mg'$  dose, start monitoring blood pressures at home and let me know if consistently >140/90 to start medication. Pt agrees with plan.

## 2022-07-08 NOTE — Patient Instructions (Addendum)
You have lumbar strain that is improving.  May continue aleve over the counter and flexeril muscle relaxant at night time as well as ice/heating pad as needed. Do exercises provided today.  Let us know if not improving with this.   BP was elevated today - I think this is likely due to naproxyn. Monitor blood pressures at home, anticipate improving readings over time.  Let me know if consistently >140/90.  Watch salt intake, ensure good water intake. Potassium rich diet can lower blood pressure (fruits/vegetables).   Return end of September/early October for lab visit only.

## 2022-07-08 NOTE — Progress Notes (Signed)
Patient ID: Edward Lester, male    DOB: 29-Jul-1946, 76 y.o.   MRN: [id number]  This visit was conducted in person.  BP (!) 152/80 (BP Location: Right Arm, Cuff Size: Normal)   Pulse 61   Temp 97.9 F (36.6 C) (Temporal)   Ht '5\' 9"'$  (1.753 m)   Wt 194 lb 6 oz (88.2 kg)   SpO2 95%   BMI 28.70 kg/m   BP Readings from Last 3 Encounters:  07/08/22 (!) 152/80  01/07/22 136/80  03/25/21 104/76  152/80 on retesting  CC: back pain  Subjective:   HPI: Edward Lester is a 76 y.o. male presenting on 07/08/2022 for Back Pain (C/o L-side low back pain.  Started 06/30/22. Thinks due to playing golf.  Occasionally feels tightness in lower back. Had video visit with [facility] on 07/02/21. Prescribed naproxen and cyclobenzaprine, helpful. )   L low back pain present for the past 1+ week. May have started after playing golf. Has some tightness to lower back as well. Seen virtually by [facility], treated with naprosyn and flexeril with benefit. Only took 1 flexeril, he's been taking 1 naprosyn per day.   Also tried ice, heat at home.  Sleeping well at night time.  No fevers/chills, shooting pain down leg numbness or weakness of leg.  No UTI symptoms.   Denies inciting trauma/injury or falls but he notes he was cutting grass and pulling weeds on Monday prior to back pain starting.  Regularly plays golf on Fridays.   Feels back pain is improving over the past few days, planning to return to golf tomorrow.   BP elevated today - possibly naprosyn related. He already limits salt/sodium in diet.      Relevant past medical, surgical, family and social history reviewed and updated as indicated. Interim medical history since our last visit reviewed. Allergies and medications reviewed and updated. Outpatient Medications Prior to Visit  Medication Sig Dispense Refill   cyclobenzaprine (FLEXERIL) 5 MG tablet Take 5 mg by mouth 3 (three) times daily as needed.     simvastatin (ZOCOR) 20 MG tablet Take 1  tablet (20 mg total) by mouth daily at 6 PM. 90 tablet 3   naproxen (NAPROSYN) 500 MG tablet Take 500 mg by mouth 2 (two) times daily.     tadalafil (CIALIS) 20 MG tablet Take 0.5-1 tablets (10-20 mg total) by mouth every other day as needed for erectile dysfunction. 15 tablet 6   No facility-administered medications prior to visit.     Per HPI unless specifically indicated in ROS section below Review of Systems  Objective:  BP (!) 152/80 (BP Location: Right Arm, Cuff Size: Normal)   Pulse 61   Temp 97.9 F (36.6 C) (Temporal)   Ht '5\' 9"'$  (1.753 m)   Wt 194 lb 6 oz (88.2 kg)   SpO2 95%   BMI 28.70 kg/m   Wt Readings from Last 3 Encounters:  07/08/22 194 lb 6 oz (88.2 kg)  01/07/22 190 lb (86.2 kg)  03/25/21 186 lb (84.4 kg)      Physical Exam Vitals and nursing note reviewed.  Constitutional:      Appearance: Normal appearance. He is not ill-appearing.  Musculoskeletal:     Right lower leg: No edema.     Left lower leg: No edema.     Comments:  No pain midline spine No paraspinous mm tenderness Neg SLR bilaterally. No pain with int/ext rotation at hip. Neg FABER. No pain at SIJ,  GTB or sciatic notch bilaterally.   Skin:    General: Skin is warm and dry.     Findings: No rash.  Neurological:     General: No focal deficit present.     Mental Status: He is alert.     Sensory: Sensation is intact.     Motor: Motor function is intact.     Deep Tendon Reflexes:     Reflex Scores:      Patellar reflexes are 2+ on the right side and 2+ on the left side.    Comments:  5/5 strength BLE Sensation intact  Psychiatric:        Mood and Affect: Mood normal.        Behavior: Behavior normal.       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       Relevant Orders   Hemoglobin H6W   Basic metabolic panel   Left-sided low back pain without sciatica - Primary    Anticipate lumbar strain after golfing and yardwork removing weeds on Monday/Tuesday last week.  Supportive care measures reviewed - ice/heat, flexeril, naprosyn - rec limiting naprosyn to '220mg'$  OTC dose.  Will mail low back pain exercises.       Relevant Medications   cyclobenzaprine (FLEXERIL) 5 MG tablet   Elevated blood pressure reading in office without diagnosis of hypertension    BP elevation noted today, in setting of naprosyn '500mg'$  use for the past week. He doesn't have history of hypertension. Advised drop naprosyn max to '220mg'$  dose, start monitoring blood pressures at home and let me know if consistently >140/90 to start medication. Pt agrees with plan.         No orders of the defined types were placed in this encounter.  Orders Placed This Encounter  Procedures   Hemoglobin A1c    Standing Status:   Future    Standing Expiration Date:   7/37/1062   Basic metabolic panel    Standing Status:   Future    Standing Expiration Date:   07/09/2023     Patient Instructions  You have lumbar strain that is improving.  May continue aleve over the counter and flexeril muscle relaxant at night time as well as ice/heating pad as needed. Do exercises provided today.  Let us know if not improving with this.   BP was elevated today - I think this is likely due to naproxyn. Monitor blood pressures at home, anticipate improving readings over time.  Let me know if consistently >140/90.  Watch salt intake, ensure good water intake. Potassium rich diet can lower blood pressure (fruits/vegetables).   Return end of September/early October for lab visit only.   Follow up plan: Return if symptoms worsen or fail to improve.  Ria Bush, MD

## 2022-07-08 NOTE — Assessment & Plan Note (Addendum)
Chronic, diet controlled, will return after upcoming trip to [place] for rpt labs.

## 2022-07-08 NOTE — Assessment & Plan Note (Signed)
Anticipate lumbar strain after golfing and yardwork removing weeds on Monday/Tuesday last week.  Supportive care measures reviewed - ice/heat, flexeril, naprosyn - rec limiting naprosyn to '220mg'$  OTC dose.  Will mail low back pain exercises.

## 2022-08-08 DIAGNOSIS — Z531 Procedure and treatment not carried out because of patient's decision for reasons of belief and group pressure: Secondary | ICD-10-CM | POA: Insufficient documentation

## 2022-08-11 DIAGNOSIS — Z961 Presence of intraocular lens: Secondary | ICD-10-CM | POA: Insufficient documentation

## 2022-08-11 DIAGNOSIS — J449 Chronic obstructive pulmonary disease, unspecified: Secondary | ICD-10-CM | POA: Diagnosis not present

## 2022-08-11 DIAGNOSIS — I2510 Atherosclerotic heart disease of native coronary artery without angina pectoris: Secondary | ICD-10-CM | POA: Diagnosis not present

## 2022-08-11 DIAGNOSIS — E119 Type 2 diabetes mellitus without complications: Secondary | ICD-10-CM | POA: Diagnosis not present

## 2022-08-11 DIAGNOSIS — H25091 Other age-related incipient cataract, right eye: Secondary | ICD-10-CM | POA: Diagnosis not present

## 2022-08-11 DIAGNOSIS — H25811 Combined forms of age-related cataract, right eye: Secondary | ICD-10-CM | POA: Diagnosis not present

## 2022-08-11 DIAGNOSIS — M1711 Unilateral primary osteoarthritis, right knee: Secondary | ICD-10-CM | POA: Diagnosis not present

## 2022-08-12 DIAGNOSIS — H25812 Combined forms of age-related cataract, left eye: Secondary | ICD-10-CM | POA: Insufficient documentation

## 2022-12-04 ENCOUNTER — Telehealth: Payer: Self-pay | Admitting: Family Medicine

## 2022-12-04 NOTE — Telephone Encounter (Signed)
LVM for pt to rtn my call to schedule AWV with [facility] call back [phone] please schedule this appt if patient calls the office.

## 2023-01-04 ENCOUNTER — Other Ambulatory Visit: Payer: Self-pay | Admitting: Family Medicine

## 2023-01-04 DIAGNOSIS — Z125 Encounter for screening for malignant neoplasm of prostate: Secondary | ICD-10-CM

## 2023-01-04 DIAGNOSIS — E1169 Type 2 diabetes mellitus with other specified complication: Secondary | ICD-10-CM

## 2023-01-04 DIAGNOSIS — E1136 Type 2 diabetes mellitus with diabetic cataract: Secondary | ICD-10-CM

## 2023-01-06 ENCOUNTER — Other Ambulatory Visit (INDEPENDENT_AMBULATORY_CARE_PROVIDER_SITE_OTHER): Payer: Medicare Other

## 2023-01-06 DIAGNOSIS — E1136 Type 2 diabetes mellitus with diabetic cataract: Secondary | ICD-10-CM

## 2023-01-06 DIAGNOSIS — Z125 Encounter for screening for malignant neoplasm of prostate: Secondary | ICD-10-CM | POA: Diagnosis not present

## 2023-01-06 DIAGNOSIS — E1169 Type 2 diabetes mellitus with other specified complication: Secondary | ICD-10-CM | POA: Diagnosis not present

## 2023-01-06 DIAGNOSIS — E785 Hyperlipidemia, unspecified: Secondary | ICD-10-CM

## 2023-01-06 LAB — MICROALBUMIN / CREATININE URINE RATIO
Creatinine,U: 182.7 mg/dL
Microalb Creat Ratio: 0.7 mg/g (ref 0.0–30.0)
Microalb, Ur: 1.3 mg/dL (ref 0.0–1.9)

## 2023-01-06 LAB — COMPREHENSIVE METABOLIC PANEL
ALT: 11 U/L (ref 0–53)
AST: 14 U/L (ref 0–37)
Albumin: 3.7 g/dL (ref 3.5–5.2)
Alkaline Phosphatase: 70 U/L (ref 39–117)
BUN: 15 mg/dL (ref 6–23)
CO2: 25 mEq/L (ref 19–32)
Calcium: 9 mg/dL (ref 8.4–10.5)
Chloride: 108 mEq/L (ref 96–112)
Creatinine, Ser: 0.99 mg/dL (ref 0.40–1.50)
GFR: 74.52 mL/min (ref >60.00)
Glucose, Bld: 119 mg/dL — ABNORMAL HIGH (ref 70–99)
Potassium: 4.2 mEq/L (ref 3.5–5.1)
Sodium: 139 mEq/L (ref 135–145)
Total Bilirubin: 0.6 mg/dL (ref 0.2–1.2)
Total Protein: 6.8 g/dL (ref 6.0–8.3)

## 2023-01-06 LAB — LIPID PANEL
Cholesterol: 152 mg/dL (ref 0–200)
HDL: 39.5 mg/dL (ref >39.00)
LDL Cholesterol: 95 mg/dL (ref 0–99)
NonHDL: 112.68
Total CHOL/HDL Ratio: 4
Triglycerides: 87 mg/dL (ref 0.0–149.0)
VLDL: 17.4 mg/dL (ref 0.0–40.0)

## 2023-01-06 LAB — HEMOGLOBIN A1C: Hgb A1c MFr Bld: 6.9 % — ABNORMAL HIGH (ref 4.6–6.5)

## 2023-01-06 LAB — PSA: PSA: 1.29 ng/mL (ref 0.10–4.00)

## 2023-01-09 ENCOUNTER — Ambulatory Visit: Payer: Medicare (Managed Care)

## 2023-01-12 ENCOUNTER — Other Ambulatory Visit: Payer: Self-pay | Admitting: Family Medicine

## 2023-01-12 ENCOUNTER — Ambulatory Visit (INDEPENDENT_AMBULATORY_CARE_PROVIDER_SITE_OTHER): Payer: Medicare Other

## 2023-01-12 VITALS — Wt 194.0 lb

## 2023-01-12 DIAGNOSIS — Z0000 Encounter for general adult medical examination without abnormal findings: Secondary | ICD-10-CM

## 2023-01-12 NOTE — Patient Instructions (Signed)
Edward Lester , Thank you for taking time to come for your Medicare Wellness Visit. I appreciate your ongoing commitment to your health goals. Please review the following plan we discussed and let me know if I can assist you in the future.   These are the goals we discussed:  Goals      Patient Stated     Lose 8-10 lbs by October         This is a list of the screening recommended for you and due dates:  Health Maintenance  Topic Date Due   Flu Shot  06/10/2022   COVID-19 Vaccine (4 - 2023-24 season) 07/11/2022   Complete foot exam   01/07/2023   Eye exam for diabetics  04/30/2023   Hemoglobin A1C  07/07/2023   Colon Cancer Screening  01/05/2024   Yearly kidney function blood test for diabetes  01/07/2024   Yearly kidney health urinalysis for diabetes  01/07/2024   Medicare Annual Wellness Visit  01/12/2024   DTaP/Tdap/Td vaccine (2 - Td or Tdap) 12/08/2026   Pneumonia Vaccine  Completed   Hepatitis C Screening: USPSTF Recommendation to screen - Ages 24-79 yo.  Completed   Zoster (Shingles) Vaccine  Completed   HPV Vaccine  Aged Out    Advanced directives: copies in chart   Conditions/risks identified: lose 8- 10 lbs by October   Next appointment: Follow up in one year for your annual wellness visit.   Preventive Care 76 Years and Older, Male  Preventive care refers to lifestyle choices and visits with your health care provider that can promote health and wellness. What does preventive care include? A yearly physical exam. This is also called an annual well check. Dental exams once or twice a year. Routine eye exams. Ask your health care provider how often you should have your eyes checked. Personal lifestyle choices, including: Daily care of your teeth and gums. Regular physical activity. Eating a healthy diet. Avoiding tobacco and drug use. Limiting alcohol use. Practicing safe sex. Taking low doses of aspirin every day. Taking vitamin and mineral supplements as  recommended by your health care provider. What happens during an annual well check? The services and screenings done by your health care provider during your annual well check will depend on your age, overall health, lifestyle risk factors, and family history of disease. Counseling  Your health care provider may ask you questions about your: Alcohol use. Tobacco use. Drug use. Emotional well-being. Home and relationship well-being. Sexual activity. Eating habits. History of falls. Memory and ability to understand (cognition). Work and work Statistician. Screening  You may have the following tests or measurements: Height, weight, and BMI. Blood pressure. Lipid and cholesterol levels. These may be checked every 5 years, or more frequently if you are over 76 years old. Skin check. Lung cancer screening. You may have this screening every year starting at age 76 if you have a 30-pack-year history of smoking and currently smoke or have quit within the past 15 years. Fecal occult blood test (FOBT) of the stool. You may have this test every year starting at age 76. Flexible sigmoidoscopy or colonoscopy. You may have a sigmoidoscopy every 5 years or a colonoscopy every 10 years starting at age 76. Prostate cancer screening. Recommendations will vary depending on your family history and other risks. Hepatitis C blood test. Hepatitis B blood test. Sexually transmitted disease (STD) testing. Diabetes screening. This is done by checking your blood sugar (glucose) after you have not eaten for  a while (fasting). You may have this done every 1-3 years. Abdominal aortic aneurysm (AAA) screening. You may need this if you are a current or former smoker. Osteoporosis. You may be screened starting at age 76 if you are at high risk. Talk with your health care provider about your test results, treatment options, and if necessary, the need for more tests. Vaccines  Your health care provider may recommend  certain vaccines, such as: Influenza vaccine. This is recommended every year. Tetanus, diphtheria, and acellular pertussis (Tdap, Td) vaccine. You may need a Td booster every 10 years. Zoster vaccine. You may need this after age 76. Pneumococcal 13-valent conjugate (PCV13) vaccine. One dose is recommended after age 76. Pneumococcal polysaccharide (PPSV23) vaccine. One dose is recommended after age 76. Talk to your health care provider about which screenings and vaccines you need and how often you need them. This information is not intended to replace advice given to you by your health care provider. Make sure you discuss any questions you have with your health care provider. Document Released: 11/23/2015 Document Revised: 07/16/2016 Document Reviewed: 08/28/2015 Elsevier Interactive Patient Education  2017 [place] Prevention in the Home Falls can cause injuries. They can happen to people of all ages. There are many things you can do to make your home safe and to help prevent falls. What can I do on the outside of my home? Regularly fix the edges of walkways and driveways and fix any cracks. Remove anything that might make you trip as you walk through a door, such as a raised step or threshold. Trim any bushes or trees on the path to your home. Use bright outdoor lighting. Clear any walking paths of anything that might make someone trip, such as rocks or tools. Regularly check to see if handrails are loose or broken. Make sure that both sides of any steps have handrails. Any raised decks and porches should have guardrails on the edges. Have any leaves, snow, or ice cleared regularly. Use sand or salt on walking paths during winter. Clean up any spills in your garage right away. This includes oil or grease spills. What can I do in the bathroom? Use night lights. Install grab bars by the toilet and in the tub and shower. Do not use towel bars as grab bars. Use non-skid mats or  decals in the tub or shower. If you need to sit down in the shower, use a plastic, non-slip stool. Keep the floor dry. Clean up any water that spills on the floor as soon as it happens. Remove soap buildup in the tub or shower regularly. Attach bath mats securely with double-sided non-slip rug tape. Do not have throw rugs and other things on the floor that can make you trip. What can I do in the bedroom? Use night lights. Make sure that you have a light by your bed that is easy to reach. Do not use any sheets or blankets that are too big for your bed. They should not hang down onto the floor. Have a firm chair that has side arms. You can use this for support while you get dressed. Do not have throw rugs and other things on the floor that can make you trip. What can I do in the kitchen? Clean up any spills right away. Avoid walking on wet floors. Keep items that you use a lot in easy-to-reach places. If you need to reach something above you, use a strong step stool that has a  grab bar. Keep electrical cords out of the way. Do not use floor polish or wax that makes floors slippery. If you must use wax, use non-skid floor wax. Do not have throw rugs and other things on the floor that can make you trip. What can I do with my stairs? Do not leave any items on the stairs. Make sure that there are handrails on both sides of the stairs and use them. Fix handrails that are broken or loose. Make sure that handrails are as long as the stairways. Check any carpeting to make sure that it is firmly attached to the stairs. Fix any carpet that is loose or worn. Avoid having throw rugs at the top or bottom of the stairs. If you do have throw rugs, attach them to the floor with carpet tape. Make sure that you have a light switch at the top of the stairs and the bottom of the stairs. If you do not have them, ask someone to add them for you. What else can I do to help prevent falls? Wear shoes that: Do not  have high heels. Have rubber bottoms. Are comfortable and fit you well. Are closed at the toe. Do not wear sandals. If you use a stepladder: Make sure that it is fully opened. Do not climb a closed stepladder. Make sure that both sides of the stepladder are locked into place. Ask someone to hold it for you, if possible. Clearly mark and make sure that you can see: Any grab bars or handrails. First and last steps. Where the edge of each step is. Use tools that help you move around (mobility aids) if they are needed. These include: Canes. Walkers. Scooters. Crutches. Turn on the lights when you go into a dark area. Replace any light bulbs as soon as they burn out. Set up your furniture so you have a clear path. Avoid moving your furniture around. If any of your floors are uneven, fix them. If there are any pets around you, be aware of where they are. Review your medicines with your doctor. Some medicines can make you feel dizzy. This can increase your chance of falling. Ask your doctor what other things that you can do to help prevent falls. This information is not intended to replace advice given to you by your health care provider. Make sure you discuss any questions you have with your health care provider. Document Released: 08/23/2009 Document Revised: 04/03/2016 Document Reviewed: 12/01/2014 Elsevier Interactive Patient Education  2017 [employer].

## 2023-01-12 NOTE — Progress Notes (Signed)
I connected with  Evon Villasenor on 01/12/23 by a audio enabled telemedicine application and verified that I am speaking with the correct person using two identifiers.  Patient Location: Home  Provider Location: Office/Clinic  I discussed the limitations of evaluation and management by telemedicine. The patient expressed understanding and agreed to proceed.   Subjective:   Edward Lester is a 77 y.o. male who presents for Medicare Annual/Subsequent preventive examination.  Review of Systems     Cardiac Risk Factors include: advanced age (>8mn, >>26women);male gender;dyslipidemia;hypertension;diabetes mellitus     Objective:    Today's Vitals   01/12/23 1518  Weight: 194 lb (88 kg)   Body mass index is 28.65 kg/m.     01/12/2023    3:24 PM 08/27/2020    9:00 AM 01/17/2020    1:29 PM 01/04/2019    9:03 AM  Advanced Directives  Does Patient Have a Medical Advance Directive? Yes No Yes Yes  Type of [place]  Living will;Healthcare Power of [place]  Does patient want to make changes to medical advance directive? No - Patient declined  No - Patient declined   Copy of HSanta Margaritain Chart? Yes - validated most recent copy scanned in chart (See row information)   Yes - validated most recent copy scanned in chart (See row information)  Would patient like information on creating a medical advance directive?  No - Patient declined      Current Medications (verified) Outpatient Encounter Medications as of 01/12/2023  Medication Sig   VITAMIN D PO Take by mouth.   simvastatin (ZOCOR) 20 MG tablet Take 1 tablet (20 mg total) by mouth daily at 6 PM.   [DISCONTINUED] cyclobenzaprine (FLEXERIL) 5 MG tablet Take 5 mg by mouth 3 (three) times daily as needed.   No facility-administered encounter medications on file as of 01/12/2023.    Allergies (verified) Patient has no known allergies.    History: Past Medical History:  Diagnosis Date   Aortic atherosclerosis ([place] 12/29/2016   By CT   CAD (coronary artery disease) 12/29/2016   By CT   Diabetes mellitus type 2, controlled, without complications ([place] [id number]  ED (erectile dysfunction) of organic origin    Emphysema lung ([place] 12/29/2016   Moderate changes of centrilobular and paraseptal emphysema identified. Diffuse bronchial wall thickening noted. By CT 12/2016   Ex-smoker quit ~2005   30 PY hx   History of colonic polyps    HLD (hyperlipidemia)    Osteoarthritis of knee 2015   severe predominantly R   Refusal of blood transfusions as patient is Jehovah's Witness    Past Surgical History:  Procedure Laterality Date   CARPAL TUNNEL RELEASE Left 2013   COLONOSCOPY  2009   1 hyperplastic polyp ([facility]   COLONOSCOPY WITH PROPOFOL N/A 01/04/2019   TAx2, HPx1, diverticulosis, rpt 5 yrs ([name] [name], MD)   FOOT SURGERY Left 2014   bone seperation on little toe   LIPOMA EXCISION  2005   right back   Family History  Problem Relation Age of Onset   Lung cancer Mother 772      (smoker)   Stomach cancer Father 750      (smoker)   Thyroid disease Sister    CAD Neg Hx    Stroke Neg Hx    Diabetes Neg Hx    Social History   Socioeconomic History   Marital status:  Married    Spouse name: Not on file   Number of children: 4   Years of education: Not on file   Highest education level: Not on file  Occupational History   Occupation: sales rep  Tobacco Use   Smoking status: Former    Packs/day: 1.00    Years: 30.00    Total pack years: 30.00    Types: Cigarettes    Start date: 11/11/1967    Quit date: 11/09/2004    Years since quitting: 18.1   Smokeless tobacco: Never  Vaping Use   Vaping Use: Never used  Substance and Sexual Activity   Alcohol use: Yes    Alcohol/week: 2.0 standard drinks of alcohol    Types: 1 Cans of beer, 1 Glasses of wine per week    Comment: social   Drug use: No    Sexual activity: Not on file  Other Topics Concern   Not on file  Social History Narrative   Lives with wife   Grown children   [place] witness   Occupation: Printmaker rep - Education officer, museum   Edu: college   Activity: golfing   Diet: good water, fruits/vegetables daily   Social Determinants of Health   Financial Resource Strain: Low Risk  (01/12/2023)   Overall Financial Resource Strain (CARDIA)    Difficulty of Paying Living Expenses: Not hard at all  Food Insecurity: No Food Insecurity (01/12/2023)   Hunger Vital Sign    Worried About Running Out of Food in the Last Year: Never true    [place] in the Last Year: Never true  Transportation Needs: No Transportation Needs (01/12/2023)   PRAPARE - Hydrologist (Medical): No    Lack of Transportation (Non-Medical): No  Physical Activity: Sufficiently Active (01/12/2023)   Exercise Vital Sign    Days of Exercise per Week: 2 days    Minutes of Exercise per Session: 150+ min  Stress: No Stress Concern Present (01/12/2023)   [place]    Feeling of Stress : Not at all  Social Connections: Moderately Integrated (01/12/2023)   Social Connection and Isolation Panel [NHANES]    Frequency of Communication with Friends and Family: More than three times a week    Frequency of Social Gatherings with Friends and Family: More than three times a week    Attends Religious Services: More than 4 times per year    Active Member of [employer] or Organizations: No    Attends Music therapist: Never    Marital Status: Married    Tobacco Counseling Counseling given: Not Answered   Clinical Intake:  Pre-visit preparation completed: Yes  Pain : No/denies pain     BMI - recorded: 28.65 Nutritional Status: BMI 25 -29 Overweight Nutritional Risks: None Diabetes: Yes CBG done?: No Did pt. bring in CBG monitor from home?:  No  How often do you need to have someone help you when you read instructions, pamphlets, or other written materials from your doctor or pharmacy?: 1 - Never  Diabetic?Nutrition Risk Assessment:  Has the patient had any N/V/D within the last 2 months?  No  Does the patient have any non-healing wounds?  No  Has the patient had any unintentional weight loss or weight gain?  No   Diabetes:  Is the patient diabetic?  Yes  If diabetic, was a CBG obtained today?  No  Did the patient bring in  their glucometer from home?  No  How often do you monitor your CBG's? N/a .   Financial Strains and Diabetes Management:  Are you having any financial strains with the device, your supplies or your medication? No .  Does the patient want to be seen by Chronic Care Management for management of their diabetes?  No  Would the patient like to be referred to a Nutritionist or for Diabetic Management?  No   Diabetic Exams:  Diabetic Eye Exam: Completed 04/29/22 Diabetic Foot Exam: Completed 01/07/22   Interpreter Needed?: No      Activities of Daily Living    01/12/2023    3:25 PM  In your present state of health, do you have any difficulty performing the following activities:  Hearing? 0  Vision? 0  Difficulty concentrating or making decisions? 0  Walking or climbing stairs? 0  Dressing or bathing? 0  Doing errands, shopping? 0  Preparing Food and eating ? N  Using the Toilet? N  In the past six months, have you accidently leaked urine? N  Do you have problems with loss of bowel control? N  Managing your Medications? N  Managing your Finances? N  Housekeeping or managing your Housekeeping? N    Patient Care Team: Ria Bush, MD as PCP - General (Family Medicine)  Indicate any recent Medical Services you may have received from other than [facility] providers in the past year (date may be approximate).     Assessment:   This is a routine wellness examination for  Ricki.  Hearing/Vision screen Hearing Screening - Comments:: Pt denies any hearing issues  Vision Screening - Comments:: Pt follows up with [facility] in [place] for annual eye exams  Dietary issues and exercise activities discussed: Current Exercise Habits: Home exercise routine, Type of exercise: walking, Time (Minutes): > 60, Frequency (Times/Week): 2, Weekly Exercise (Minutes/Week): 0   Goals Addressed             This Visit's Progress    Patient Stated       Lose 8-10 lbs by October        Depression Screen    01/12/2023    3:23 PM 01/07/2022    2:02 PM 03/25/2021    8:26 AM 01/17/2020    1:29 PM 12/19/2019    8:27 AM 12/15/2018   10:35 AM  PHQ 2/9 Scores  PHQ - 2 Score 0 0 0 0 1 0  PHQ- 9 Score   0       Fall Risk    01/12/2023    3:25 PM 01/07/2022    2:02 PM 03/25/2021    8:27 AM 02/01/2020   12:43 PM 01/17/2020    1:29 PM  Fall Risk   Falls in the past year? 0 0 0 0 0  Number falls in past yr: 0      Injury with Fall? 0      Risk for fall due to : Impaired vision      Follow up Falls prevention discussed        FALL RISK PREVENTION PERTAINING TO THE HOME:  Any stairs in or around the home? Yes  If so, are there any without handrails? No  Home free of loose throw rugs in walkways, pet beds, electrical cords, etc? Yes  Adequate lighting in your home to reduce risk of falls? Yes   ASSISTIVE DEVICES UTILIZED TO PREVENT FALLS:  Life alert? No  Use of a cane, walker or w/c?  No  Grab bars in the bathroom? No  Shower chair or bench in shower? No  Elevated toilet seat or a handicapped toilet? No   TIMED UP AND GO:  Was the test performed? No .   Cognitive Function:        01/12/2023    3:26 PM  6CIT Screen  What Year? 0 points  What month? 0 points  What time? 0 points  Count back from 20 0 points  Months in reverse 0 points  Repeat phrase 0 points  Total Score 0 points    Immunizations Immunization History  Administered Date(s)  Administered   Fluad Quad(high Dose 65+) 10/31/2019, 12/31/2020, 01/07/2022   Influenza,inj,Quad PF,6+ Mos 07/19/2014, 12/03/2015, 07/07/2016, 11/30/2017, 12/15/2018   Moderna Sars-Covid-2 Vaccination 01/27/2020, 02/29/2020, 10/10/2020   Pneumococcal Conjugate-13 05/16/2015   Pneumococcal Polysaccharide-23 08/15/2008, 11/28/2013   Tdap 12/08/2016   Zoster Recombinat (Shingrix) 12/28/2018, 05/27/2019   Zoster, Live 07/10/2007    TDAP status: Up to date  Flu Vaccine status: Due, Education has been provided regarding the importance of this vaccine. Advised may receive this vaccine at local pharmacy or Health Dept. Aware to provide a copy of the vaccination record if obtained from local pharmacy or Health Dept. Verbalized acceptance and understanding.  Pneumococcal vaccine status: Up to date  Covid-19 vaccine status: Information provided on how to obtain vaccines.   Qualifies for Shingles Vaccine? Yes   Zostavax completed Yes   Shingrix Completed?: Yes  Screening Tests Health Maintenance  Topic Date Due   INFLUENZA VACCINE  06/10/2022   COVID-19 Vaccine (4 - 2023-24 season) 07/11/2022   FOOT EXAM  01/07/2023   OPHTHALMOLOGY EXAM  04/30/2023   HEMOGLOBIN A1C  07/07/2023   COLONOSCOPY (Pts 45-39yr Insurance coverage will need to be confirmed)  01/05/2024   Diabetic kidney evaluation - eGFR measurement  01/07/2024   Diabetic kidney evaluation - Urine ACR  01/07/2024   Medicare Annual Wellness (AWV)  01/12/2024   DTaP/Tdap/Td (2 - Td or Tdap) 12/08/2026   Pneumonia Vaccine 77 Years old  Completed   Hepatitis C Screening  Completed   Zoster Vaccines- Shingrix  Completed   HPV VACCINES  Aged Out    Health Maintenance  Health Maintenance Due  Topic Date Due   INFLUENZA VACCINE  06/10/2022   COVID-19 Vaccine (4 - 2023-24 season) 07/11/2022   FOOT EXAM  01/07/2023    Colorectal cancer screening: Type of screening: Colonoscopy. Completed 01/04/19. Repeat every 5  years  Additional Screening:  Hepatitis C Screening:  Completed 05/16/15  Vision Screening: Recommended annual ophthalmology exams for early detection of glaucoma and other disorders of the eye. Is the patient up to date with their annual eye exam?  Yes  Who is the provider or what is the name of the office in which the patient attends annual eye exams? [facility]  If pt is not established with a provider, would they like to be referred to a provider to establish care? No .   Dental Screening: Recommended annual dental exams for proper oral hygiene  Community Resource Referral / Chronic Care Management: CRR required this visit?  No   CCM required this visit?  No      Plan:     I have personally reviewed and noted the following in the patient's chart:   Medical and social history Use of alcohol, tobacco or illicit drugs  Current medications and supplements including opioid prescriptions. Patient is not currently taking opioid prescriptions. Functional ability and  status Nutritional status Physical activity Advanced directives List of other physicians Hospitalizations, surgeries, and ER visits in previous 12 months Vitals Screenings to include cognitive, depression, and falls Referrals and appointments  In addition, I have reviewed and discussed with patient certain preventive protocols, quality metrics, and best practice recommendations. A written personalized care plan for preventive services as well as general preventive health recommendations were provided to patient.     Willette Brace, LPN   [id number]   Nurse Notes: none

## 2023-01-13 ENCOUNTER — Ambulatory Visit (INDEPENDENT_AMBULATORY_CARE_PROVIDER_SITE_OTHER): Payer: Medicare Other | Admitting: Family Medicine

## 2023-01-13 ENCOUNTER — Encounter: Payer: Self-pay | Admitting: Family Medicine

## 2023-01-13 VITALS — BP 136/68 | HR 65 | Temp 97.4°F | Ht 68.5 in | Wt 187.5 lb

## 2023-01-13 DIAGNOSIS — B353 Tinea pedis: Secondary | ICD-10-CM | POA: Diagnosis not present

## 2023-01-13 DIAGNOSIS — E1169 Type 2 diabetes mellitus with other specified complication: Secondary | ICD-10-CM | POA: Diagnosis not present

## 2023-01-13 DIAGNOSIS — Z23 Encounter for immunization: Secondary | ICD-10-CM | POA: Diagnosis not present

## 2023-01-13 DIAGNOSIS — E785 Hyperlipidemia, unspecified: Secondary | ICD-10-CM | POA: Diagnosis not present

## 2023-01-13 DIAGNOSIS — J432 Centrilobular emphysema: Secondary | ICD-10-CM | POA: Diagnosis not present

## 2023-01-13 DIAGNOSIS — I2584 Coronary atherosclerosis due to calcified coronary lesion: Secondary | ICD-10-CM | POA: Diagnosis not present

## 2023-01-13 DIAGNOSIS — Z0000 Encounter for general adult medical examination without abnormal findings: Secondary | ICD-10-CM

## 2023-01-13 DIAGNOSIS — E1136 Type 2 diabetes mellitus with diabetic cataract: Secondary | ICD-10-CM | POA: Diagnosis not present

## 2023-01-13 DIAGNOSIS — Z7189 Other specified counseling: Secondary | ICD-10-CM

## 2023-01-13 DIAGNOSIS — I2510 Atherosclerotic heart disease of native coronary artery without angina pectoris: Secondary | ICD-10-CM | POA: Diagnosis not present

## 2023-01-13 DIAGNOSIS — I700 Atherosclerosis of aorta: Secondary | ICD-10-CM | POA: Diagnosis not present

## 2023-01-13 MED ORDER — VITAMIN D 25 MCG (1000 UNIT) PO TABS: 1000.0000 [IU] | ORAL_TABLET | Freq: Every day | ORAL | Status: DC

## 2023-01-13 MED ORDER — SIMVASTATIN 20 MG PO TABS: 20.0000 mg | ORAL_TABLET | Freq: Every day | ORAL | 4 refills | Status: DC

## 2023-01-13 NOTE — Assessment & Plan Note (Signed)
Chronic, stable period on simvastatin - continue. The 10-year ASCVD risk score (Arnett DK, et al., 2019) is: 26.9%   Values used to calculate the score:     Age: 77 years     Sex: Male     Is Non-Hispanic African American: Yes     Diabetic: Yes     Tobacco smoker: No     Systolic Blood Pressure: [id number] mmHg     Is BP treated: No     HDL Cholesterol: 39.5 mg/dL     Total Cholesterol: 152 mg/dL

## 2023-01-13 NOTE — Assessment & Plan Note (Addendum)
Chronic, diet controlled. He will work on decreasing M&Ms.  RTC 6 mo A1c only.

## 2023-01-13 NOTE — Progress Notes (Signed)
Patient ID: Edward Lester, male    DOB: December 30, 1945, 77 y.o.   MRN: [id number]  This visit was conducted in person.  BP 136/68   Pulse 65   Temp (!) 97.4 F (36.3 C) (Temporal)   Ht 5' 8.5" (1.74 m)   Wt 187 lb 8 oz (85 kg)   SpO2 95%   BMI 28.09 kg/m    CC: CPE Subjective:   HPI: Edward Lester is a 77 y.o. male presenting on 01/13/2023 for Annual Exam (MCR prt 2 [AWV- 01/12/23]. )   Saw health advisor Monday for initial medicare wellness visit. Note reviewed.   No results found.  [place] Office Visit from 01/13/2023 in [place] at [facility]  PHQ-2 Total Score 0          01/13/2023   10:47 AM 01/12/2023    3:25 PM 01/07/2022    2:02 PM 03/25/2021    8:27 AM 02/01/2020   12:43 PM  Fall Risk   Falls in the past year? 0 0 0 0 0  Number falls in past yr:  0     Injury with Fall?  0     Risk for fall due to :  Impaired vision     Follow up  Falls prevention discussed       Diet controlled diabetes. Regularly sees eye doctor - 12/2022.  S/p cataract surgery 08/2022.  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   Preventative: COLONOSCOPY WITH PROPOFOL 01/04/2019 - TAx2, HPx1, diverticulosis, rpt 5 yrs [name], [name], MD)  Prostate cancer screening - screen yearly. 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 Nocturia x1-2. No fmhx prostate cancer.  Lung cancer screening - quit 2005. 30 PY history. Last lung cancer CT scan was 2020.  AAA screen normal 01/2022 Flu shot - yearly  COVID vaccine Moderna 01/2020, 02/2020, booster 10/2020  Tdap 11/2016  Pneumovax - 11/28/2013, prevnar-13 05/2015 RSV - discussed zostavax 2008 shingrix - 12/2018, 05/2019  Advanced directive - scanned 11/2016 Delcie Roch (wife) then Thurnell Garbe (friend) are HCPOA. Jehova's witness. Ok with CPR and temporary life support but not prolonged if terminal condition. Ok with feeding tube.  Seat belt use discussed  Sunscreen use discussed, no changing moles on skin  Ex smoker - quit 2005  Alcohol - occasional beer Dentist q6 mo  Eye exam  yearly  Bowel - no constipation  Bladder - no incontinence   Lives with wife Grown children Jehova's witness Occupation: Printmaker rep - Education officer, museum Edu: college  Activity: golfing 3x/wk  Diet: good water, fruits/vegetables daily - has limited sugared cereal      Relevant past medical, surgical, family and social history reviewed and updated as indicated. Interim medical history since our last visit reviewed. Allergies and medications reviewed and updated. Outpatient Medications Prior to Visit  Medication Sig Dispense Refill   simvastatin (ZOCOR) 20 MG tablet Take 1 tablet (20 mg total) by mouth daily at 6 PM. 90 tablet 3   VITAMIN D PO Take by mouth.     No facility-administered medications prior to visit.     Per HPI unless specifically indicated in ROS section below Review of Systems  Constitutional:  Negative for activity change, appetite change, chills, fatigue, fever and unexpected weight change.  HENT:  Negative for hearing loss.   Eyes:  Negative for visual disturbance.  Respiratory:  Negative for cough, chest tightness, shortness of breath and wheezing.   Cardiovascular:  Negative for chest pain, palpitations and leg swelling.  Gastrointestinal:  Negative for abdominal distention, abdominal pain, blood in stool, constipation, diarrhea, nausea and vomiting.  Genitourinary:  Negative for difficulty urinating and hematuria.  Musculoskeletal:  Negative for arthralgias, myalgias and neck pain.  Skin:  Negative for rash.  Neurological:  Negative for dizziness, seizures, syncope and headaches.  Hematological:  Negative for adenopathy. Does not bruise/bleed easily.  Psychiatric/Behavioral:  Negative for dysphoric mood. The patient is not nervous/anxious.     Objective:  BP 136/68   Pulse 65   Temp (!) 97.4 F (36.3 C) (Temporal)   Ht 5' 8.5" (1.74 m)   Wt 187 lb 8 oz (85 kg)   SpO2 95%   BMI 28.09 kg/m   Wt Readings from Last 3 Encounters:   01/13/23 187 lb 8 oz (85 kg)  01/12/23 194 lb (88 kg)  07/08/22 194 lb 6 oz (88.2 kg)      Physical Exam Vitals and nursing note reviewed.  Constitutional:      General: He is not in acute distress.    Appearance: Normal appearance. He is well-developed. He is not ill-appearing.  HENT:     Head: Normocephalic and atraumatic.     Right Ear: Hearing, tympanic membrane, ear canal and external ear normal.     Left Ear: Hearing, tympanic membrane, ear canal and external ear normal.     Mouth/Throat:     Comments: Wearing mask Eyes:     General: No scleral icterus.    Extraocular Movements: Extraocular movements intact.     Conjunctiva/sclera: Conjunctivae normal.     Pupils: Pupils are equal, round, and reactive to light.  Neck:     Thyroid: No thyroid mass or thyromegaly.     Vascular: No carotid bruit.  Cardiovascular:     Rate and Rhythm: Normal rate and regular rhythm.     Pulses: Normal pulses.          Radial pulses are 2+ on the right side and 2+ on the left side.     Heart sounds: Normal heart sounds. No murmur heard. Pulmonary:     Effort: Pulmonary effort is normal. No respiratory distress.     Breath sounds: Normal breath sounds. No wheezing, rhonchi or rales.  Abdominal:     General: Bowel sounds are normal. There is no distension.     Palpations: Abdomen is soft. There is no mass.     Tenderness: There is no abdominal tenderness. There is no guarding or rebound.     Hernia: No hernia is present.  Genitourinary:    Prostate: Normal. Not enlarged (25gm), not tender and no nodules present.     Rectum: Normal. No mass, tenderness, anal fissure, external hemorrhoid or internal hemorrhoid. Normal anal tone.  Musculoskeletal:        General: Normal range of motion.     Cervical back: Normal range of motion and neck supple.     Right lower leg: No edema.     Left lower leg: No edema.     Comments: See below for foot exam  Lymphadenopathy:     Cervical: No cervical  adenopathy.  Skin:    General: Skin is warm and dry.     Findings: No rash.  Neurological:     General: No focal deficit present.     Mental Status: He is alert and oriented to person, place, and time.  Psychiatric:        Mood and Affect: Mood normal.  Behavior: Behavior normal.        Thought Content: Thought content normal.        Judgment: Judgment normal.    Diabetic Foot Exam - Simple   Simple Foot Form Diabetic Foot exam was performed with the following findings: Yes 01/13/2023 11:01 AM  Visual Inspection See comments: Yes Sensation Testing Intact to touch and monofilament testing bilaterally: Yes Pulse Check Posterior Tibialis and Dorsalis pulse intact bilaterally: Yes Comments Maceration between 4th/5th digits on right       Results for orders placed or performed in visit on 01/06/23  PSA  Result Value Ref Range   PSA 1.29 0.10 - 4.00 ng/mL  Microalbumin / creatinine urine ratio  Result Value Ref Range   Microalb, Ur 1.3 0.0 - 1.9 mg/dL   Creatinine,U 182.7 mg/dL   Microalb Creat Ratio 0.7 0.0 - 30.0 mg/g  Hemoglobin A1c  Result Value Ref Range   Hgb A1c MFr Bld 6.9 (H) 4.6 - 6.5 %  Comprehensive metabolic panel  Result Value Ref Range   Sodium 139 135 - 145 mEq/L   Potassium 4.2 3.5 - 5.1 mEq/L   Chloride 108 96 - 112 mEq/L   CO2 25 19 - 32 mEq/L   Glucose, Bld 119 (H) 70 - 99 mg/dL   BUN 15 6 - 23 mg/dL   Creatinine, Ser 0.99 0.40 - 1.50 mg/dL   Total Bilirubin 0.6 0.2 - 1.2 mg/dL   Alkaline Phosphatase 70 39 - 117 U/L   AST 14 0 - 37 U/L   ALT 11 0 - 53 U/L   Total Protein 6.8 6.0 - 8.3 g/dL   Albumin 3.7 3.5 - 5.2 g/dL   GFR 74.52 >60.00 mL/min   Calcium 9.0 8.4 - 10.5 mg/dL  Lipid panel  Result Value Ref Range   Cholesterol 152 0 - 200 mg/dL   Triglycerides 87.0 0.0 - 149.0 mg/dL   HDL 39.50 >39.00 mg/dL   VLDL 17.4 0.0 - 40.0 mg/dL   LDL Cholesterol 95 0 - 99 mg/dL   Total CHOL/HDL Ratio 4    NonHDL 112.68     Assessment & Plan:    Problem List Items Addressed This Visit     Health care maintenance - Primary (Chronic)    Preventative protocols reviewed and updated unless pt declined. Discussed healthy diet and lifestyle.       Advanced care planning/counseling discussion (Chronic)    Previously discussed      Hyperlipidemia associated with type 2 diabetes mellitus (HCC)    Chronic, stable period on simvastatin - continue. The 10-year ASCVD risk score (Arnett DK, et al., 2019) is: 26.9%   Values used to calculate the score:     Age: 47 years     Sex: Male     Is Non-Hispanic African American: Yes     Diabetic: Yes     Tobacco smoker: No     Systolic Blood Pressure: [id number] mmHg     Is BP treated: No     HDL Cholesterol: 39.5 mg/dL     Total Cholesterol: 152 mg/dL       Relevant Medications   simvastatin (ZOCOR) 20 MG tablet   Controlled type 2 diabetes mellitus with cataract (HCC)    Chronic, diet controlled. He will work on decreasing M&Ms.  RTC 6 mo A1c only.       Relevant Medications   simvastatin (ZOCOR) 20 MG tablet   Other Relevant Orders   POCT glycosylated hemoglobin (Hb A1C)  Aortic atherosclerosis (HCC)    Continue statin.       Relevant Medications   simvastatin (ZOCOR) 20 MG tablet   Coronary artery calcification    Continue statin.       Relevant Medications   simvastatin (ZOCOR) 20 MG tablet   Emphysema lung ([place])    Incidental finding on imaging, pt asxs off respiratory medication.       Tinea pedis    Maceration interdigital area of R 4th/5th toes. Discussed lotrimin OTC use.       Other Visit Diagnoses     Need for influenza vaccination       Relevant Orders   Flu Vaccine QUAD High Dose(Fluad) (Completed)        Meds ordered this encounter  Medications   simvastatin (ZOCOR) 20 MG tablet    Sig: Take 1 tablet (20 mg total) by mouth daily at 6 PM.    Dispense:  90 tablet    Refill:  4   cholecalciferol (VITAMIN D3) 25 MCG (1000 UNIT) tablet    Sig: Take 1  tablet (1,000 Units total) by mouth daily.    Orders Placed This Encounter  Procedures   Flu Vaccine QUAD High Dose(Fluad)   POCT glycosylated hemoglobin (Hb A1C)    Standing Status:   Future    Standing Expiration Date:   01/13/2024    Patient Instructions  Flu shot today We will request diabetic eye exam from Dr Kerrin Champagne at [facility] center [phone] (Fax). You are doing well today Use lotrimin between last 2 toes on right foot Continue current medicines Return as needed or in 1 year for next physical.   Follow up plan: Return in about 1 year (around 01/13/2024), or if symptoms worsen or fail to improve, for annual exam, prior fasting for blood work, medicare wellness visit.  Ria Bush, MD

## 2023-01-13 NOTE — Assessment & Plan Note (Signed)
Continue statin. 

## 2023-01-13 NOTE — Patient Instructions (Addendum)
Flu shot today We will request diabetic eye exam from Dr Kerrin Champagne at [facility] center [phone] (Fax). You are doing well today Use lotrimin between last 2 toes on right foot Continue current medicines Return as needed or in 1 year for next physical.

## 2023-01-13 NOTE — Assessment & Plan Note (Signed)
Incidental finding on imaging, pt asxs off respiratory medication.

## 2023-01-13 NOTE — Assessment & Plan Note (Signed)
Maceration interdigital area of R 4th/5th toes. Discussed lotrimin OTC use.

## 2023-01-13 NOTE — Assessment & Plan Note (Signed)
Previously discussed.

## 2023-01-13 NOTE — Assessment & Plan Note (Signed)
Preventative protocols reviewed and updated unless pt declined. Discussed healthy diet and lifestyle.  

## 2023-02-02 DIAGNOSIS — J069 Acute upper respiratory infection, unspecified: Secondary | ICD-10-CM | POA: Diagnosis not present

## 2023-03-13 DIAGNOSIS — E119 Type 2 diabetes mellitus without complications: Secondary | ICD-10-CM | POA: Diagnosis not present

## 2023-03-13 DIAGNOSIS — H2512 Age-related nuclear cataract, left eye: Secondary | ICD-10-CM | POA: Diagnosis not present

## 2023-05-04 ENCOUNTER — Telehealth: Payer: Self-pay | Admitting: Family Medicine

## 2023-05-04 DIAGNOSIS — E1169 Type 2 diabetes mellitus with other specified complication: Secondary | ICD-10-CM

## 2023-05-04 MED ORDER — SIMVASTATIN 20 MG PO TABS: 20.0000 mg | ORAL_TABLET | Freq: Every day | ORAL | 3 refills | Status: DC

## 2023-05-04 NOTE — Telephone Encounter (Signed)
Patient contacted the office regarding medication simvastatin (ZOCOR) 20 MG tablet. States [facility] has sent request to refill this medication and he wanted to follow up. Advised pt I would make provider aware and to give more time for this to be fulfilled. Advise patient's cell number if any questions.

## 2023-05-04 NOTE — Telephone Encounter (Signed)
Looks like rx was originally sent to [facility]. [facility] was not listed in pt's chart as a pharmacy he uses.  Deleted [facility] from pt's chart and added [facility]. E-scribed rx to [facility].   Lvm asking pt to call back. Need to relay all info above.

## 2023-07-16 ENCOUNTER — Other Ambulatory Visit (INDEPENDENT_AMBULATORY_CARE_PROVIDER_SITE_OTHER): Payer: Medicare Other

## 2023-07-16 DIAGNOSIS — E1136 Type 2 diabetes mellitus with diabetic cataract: Secondary | ICD-10-CM

## 2023-07-16 LAB — POCT GLYCOSYLATED HEMOGLOBIN (HGB A1C): Hemoglobin A1C: 6.7 % — AB (ref 4.0–5.6)

## 2024-01-11 ENCOUNTER — Other Ambulatory Visit: Payer: Self-pay | Admitting: Family Medicine

## 2024-01-11 DIAGNOSIS — E785 Hyperlipidemia, unspecified: Secondary | ICD-10-CM

## 2024-01-16 ENCOUNTER — Other Ambulatory Visit: Payer: Self-pay | Admitting: Family Medicine

## 2024-01-16 DIAGNOSIS — E1136 Type 2 diabetes mellitus with diabetic cataract: Secondary | ICD-10-CM

## 2024-01-16 DIAGNOSIS — E1169 Type 2 diabetes mellitus with other specified complication: Secondary | ICD-10-CM

## 2024-01-18 ENCOUNTER — Other Ambulatory Visit (INDEPENDENT_AMBULATORY_CARE_PROVIDER_SITE_OTHER): Payer: Medicare Other

## 2024-01-18 DIAGNOSIS — E785 Hyperlipidemia, unspecified: Secondary | ICD-10-CM

## 2024-01-18 DIAGNOSIS — E1136 Type 2 diabetes mellitus with diabetic cataract: Secondary | ICD-10-CM

## 2024-01-18 DIAGNOSIS — E1169 Type 2 diabetes mellitus with other specified complication: Secondary | ICD-10-CM | POA: Diagnosis not present

## 2024-01-18 LAB — COMPREHENSIVE METABOLIC PANEL
ALT: 10 U/L (ref 0–53)
AST: 16 U/L (ref 0–37)
Albumin: 3.8 g/dL (ref 3.5–5.2)
Alkaline Phosphatase: 63 U/L (ref 39–117)
BUN: 11 mg/dL (ref 6–23)
CO2: 27 meq/L (ref 19–32)
Calcium: 8.4 mg/dL (ref 8.4–10.5)
Chloride: 106 meq/L (ref 96–112)
Creatinine, Ser: 1.07 mg/dL (ref 0.40–1.50)
GFR: 67.4 mL/min (ref >60.00)
Glucose, Bld: 145 mg/dL — ABNORMAL HIGH (ref 70–99)
Potassium: 3.9 meq/L (ref 3.5–5.1)
Sodium: 140 meq/L (ref 135–145)
Total Bilirubin: 0.6 mg/dL (ref 0.2–1.2)
Total Protein: 6.6 g/dL (ref 6.0–8.3)

## 2024-01-18 LAB — HEMOGLOBIN A1C: Hgb A1c MFr Bld: 7.2 % — ABNORMAL HIGH (ref 4.6–6.5)

## 2024-01-18 LAB — LIPID PANEL
Cholesterol: 135 mg/dL (ref 0–200)
HDL: 36.9 mg/dL — ABNORMAL LOW (ref >39.00)
LDL Cholesterol: 74 mg/dL (ref 0–99)
NonHDL: 98.31
Total CHOL/HDL Ratio: 4
Triglycerides: 121 mg/dL (ref 0.0–149.0)
VLDL: 24.2 mg/dL (ref 0.0–40.0)

## 2024-01-18 LAB — MICROALBUMIN / CREATININE URINE RATIO
Creatinine,U: 156.3 mg/dL
Microalb Creat Ratio: 6.3 mg/g (ref 0.0–30.0)
Microalb, Ur: 1 mg/dL (ref 0.0–1.9)

## 2024-01-18 LAB — VITAMIN B12: Vitamin B-12: >1537 pg/mL — ABNORMAL HIGH (ref 211–911)

## 2024-01-26 ENCOUNTER — Ambulatory Visit: Payer: Medicare Other | Admitting: Family Medicine

## 2024-01-26 ENCOUNTER — Encounter: Payer: Self-pay | Admitting: Family Medicine

## 2024-01-26 VITALS — BP 122/72 | HR 62 | Temp 98.1°F | Ht 69.0 in | Wt 187.2 lb

## 2024-01-26 DIAGNOSIS — B353 Tinea pedis: Secondary | ICD-10-CM

## 2024-01-26 DIAGNOSIS — E785 Hyperlipidemia, unspecified: Secondary | ICD-10-CM

## 2024-01-26 DIAGNOSIS — Z87891 Personal history of nicotine dependence: Secondary | ICD-10-CM | POA: Diagnosis not present

## 2024-01-26 DIAGNOSIS — I2510 Atherosclerotic heart disease of native coronary artery without angina pectoris: Secondary | ICD-10-CM

## 2024-01-26 DIAGNOSIS — Z125 Encounter for screening for malignant neoplasm of prostate: Secondary | ICD-10-CM

## 2024-01-26 DIAGNOSIS — E1169 Type 2 diabetes mellitus with other specified complication: Secondary | ICD-10-CM

## 2024-01-26 DIAGNOSIS — J432 Centrilobular emphysema: Secondary | ICD-10-CM

## 2024-01-26 DIAGNOSIS — Z23 Encounter for immunization: Secondary | ICD-10-CM | POA: Diagnosis not present

## 2024-01-26 DIAGNOSIS — E1136 Type 2 diabetes mellitus with diabetic cataract: Secondary | ICD-10-CM

## 2024-01-26 DIAGNOSIS — Z0000 Encounter for general adult medical examination without abnormal findings: Secondary | ICD-10-CM

## 2024-01-26 DIAGNOSIS — Z1211 Encounter for screening for malignant neoplasm of colon: Secondary | ICD-10-CM

## 2024-01-26 DIAGNOSIS — E678 Other specified hyperalimentation: Secondary | ICD-10-CM | POA: Diagnosis not present

## 2024-01-26 DIAGNOSIS — Z7189 Other specified counseling: Secondary | ICD-10-CM

## 2024-01-26 MED ORDER — SIMVASTATIN 20 MG PO TABS: 20.0000 mg | ORAL_TABLET | Freq: Every day | ORAL | 4 refills | Status: AC

## 2024-01-26 NOTE — Assessment & Plan Note (Addendum)
 Chronic, stable on simvastatin - continue current regimen. The 10-year ASCVD risk score (Arnett DK, et al., 2019) is: 23%   Values used to calculate the score:     Age: 78 years     Sex: Male     Is Non-Hispanic African American: Yes     Diabetic: Yes     Tobacco smoker: No     Systolic Blood Pressure: 122 mmHg     Is BP treated: No     HDL Cholesterol: 36.9 mg/dL     Total Cholesterol: 135 mg/dL

## 2024-01-26 NOTE — Assessment & Plan Note (Signed)

## 2024-01-26 NOTE — Assessment & Plan Note (Signed)
 Preventative protocols reviewed and updated unless pt declined. Discussed healthy diet and lifestyle.

## 2024-01-26 NOTE — Progress Notes (Unsigned)
 Ph: [phone] Fax: [phone]   Patient ID: Edward Lester, male    DOB: Jul 19, 1946, 78 y.o.   MRN: [id number]  This visit was conducted in person.  BP 122/72   Pulse 62   Temp 98.1 F (36.7 C) (Oral)   Ht 5\' 9"  (1.753 m)   Wt 187 lb 4 oz (84.9 kg)   SpO2 96%   BMI 27.65 kg/m    CC: CPE Subjective:   HPI: Edward Lester is a 78 y.o. male presenting on 01/26/2024 for Medicare Wellness   Did not see health advisor this year.   Hearing Screening   500Hz  1000Hz  2000Hz  4000Hz   Right ear 20 20 20 20   Left ear 20 20 20  0   Vision Screening   Right eye Left eye Both eyes  Without correction     With correction 20/25 20/25 20/25     Flowsheet Row Office Visit from 01/26/2024 in [facility] [place]  PHQ-2 Total Score 0          01/26/2024    3:50 PM 01/13/2023   10:47 AM 01/12/2023    3:25 PM 01/07/2022    2:02 PM 03/25/2021    8:27 AM  Fall Risk   Falls in the past year? 0 0 0 0 0  Number falls in past yr:   0    Injury with Fall?   0    Risk for fall due to :   Impaired vision    Follow up   Falls prevention discussed     Diet controlled diabetes. Regularly sees eye doctor - 12/2022.  S/p cataract surgery 08/2022.  Retired last year.   Has been on B12 1-2/day, recent levels elevated.  Diabetic eye exam - no retinopathy (03/2023)  Preventative: COLONOSCOPY WITH PROPOFOL 01/04/2019 - TAx2, HPx1, diverticulosis, rpt 5 yrs Servando Snare, [name], MD).  Prostate cancer screening - screen yearly. 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 Nocturia x1-2. No fmhx prostate cancer. requests 1 more PSA check  Lung cancer screening - quit 2005. 30 PY history. Last lung cancer CT scan was 2020.  AAA screen normal 01/2022 Flu shot - yearly  COVID vaccine Moderna 01/2020, 02/2020, booster 10/2020  Tdap 11/2016  Pneumovax - 11/28/2013, prevnar-13 05/2015  RSV - discussed zostavax 2008 shingrix - 12/2018, 05/2019  Advanced directive - scanned 11/2016 Janelle Floor (wife) then Bennie Pierini  (friend) are HCPOA. Jehova's witness. Ok with CPR and temporary life support but not prolonged if terminal condition. Ok with feeding tube.  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 Interim medical history since our last visit reviewed. Allergies and medications reviewed and updated. Outpatient Medications Prior to Visit  Medication Sig Dispense Refill   cyanocobalamin (VITAMIN B12) 1000 MCG tablet Take 1,000 mcg by mouth daily.     simvastatin (ZOCOR) 20 MG tablet TAKE 1 TABLET BY MOUTH DAILY AT  6 PM. 90 tablet 0   cholecalciferol (VITAMIN D3) 25 MCG (1000 UNIT) tablet Take  1 tablet (1,000 Units total) by mouth daily.     No facility-administered medications prior to visit.     Per HPI unless specifically indicated in ROS section below Review of Systems  Constitutional:  Negative for activity change, appetite change, chills, fatigue, fever and unexpected weight change.  HENT:  Negative for hearing loss.   Eyes:  Negative for visual disturbance.  Respiratory:  Negative for cough, chest tightness, shortness of breath and wheezing.   Cardiovascular:  Negative for chest pain, palpitations and leg swelling.  Gastrointestinal:  Negative for abdominal distention, abdominal pain, blood in stool, constipation, diarrhea, nausea and vomiting.  Genitourinary:  Negative for difficulty urinating and hematuria.  Musculoskeletal:  Negative for arthralgias, myalgias and neck pain.  Skin:  Negative for rash.   Neurological:  Negative for dizziness, seizures, syncope and headaches.  Hematological:  Negative for adenopathy. Does not bruise/bleed easily.  Psychiatric/Behavioral:  Negative for dysphoric mood. The patient is not nervous/anxious.     Objective:  BP 122/72   Pulse 62   Temp 98.1 F (36.7 C) (Oral)   Ht 5\' 9"  (1.753 m)   Wt 187 lb 4 oz (84.9 kg)   SpO2 96%   BMI 27.65 kg/m   Wt Readings from Last 3 Encounters:  01/26/24 187 lb 4 oz (84.9 kg)  01/13/23 187 lb 8 oz (85 kg)  01/12/23 194 lb (88 kg)      Physical Exam Vitals and nursing note reviewed.  Constitutional:      General: He is not in acute distress.    Appearance: Normal appearance. He is well-developed. He is not ill-appearing.  HENT:     Head: Normocephalic and atraumatic.     Right Ear: Hearing, tympanic membrane, ear canal and external ear normal.     Left Ear: Hearing, tympanic membrane, ear canal and external ear normal.     Mouth/Throat:     Mouth: Mucous membranes are moist.     Pharynx: Oropharynx is clear. No oropharyngeal exudate or posterior oropharyngeal erythema.  Eyes:     General: No scleral icterus.    Extraocular Movements: Extraocular movements intact.     Conjunctiva/sclera: Conjunctivae normal.     Pupils: Pupils are equal, round, and reactive to light.  Neck:     Thyroid: No thyroid mass or thyromegaly.  Cardiovascular:     Rate and Rhythm: Normal rate and regular rhythm.     Pulses: Normal pulses.          Radial pulses are 2+ on the right side and 2+ on the left side.     Heart sounds: Normal heart sounds. No murmur heard. Pulmonary:     Effort: Pulmonary effort is normal. No respiratory distress.     Breath sounds: Normal breath sounds. No wheezing, rhonchi or rales.  Abdominal:     General: Bowel sounds are normal. There is no distension.     Palpations: Abdomen is soft. There is no mass.     Tenderness: There is no abdominal tenderness. There is no guarding or rebound.      Hernia: No hernia is present.  Musculoskeletal:        General: Normal range of motion.     Cervical back: Normal range of motion and neck supple.     Right lower leg: No edema.     Left lower leg: No edema.  Lymphadenopathy:     Cervical: No cervical adenopathy.  Skin:    General: Skin is warm and dry.  Findings: No rash.  Neurological:     General: No focal deficit present.     Mental Status: He is alert and oriented to person, place, and time.     Comments:  Recall 3/3 Calculation 5/5 DLROW  Psychiatric:        Mood and Affect: Mood normal.        Behavior: Behavior normal.        Thought Content: Thought content normal.        Judgment: Judgment normal.    Diabetic Foot Exam - Simple   Simple Foot Form Diabetic Foot exam was performed with the following findings: Yes 01/26/2024  3:44 PM  Visual Inspection No deformities, no ulcerations, no other skin breakdown bilaterally: Yes Sensation Testing Intact to touch and monofilament testing bilaterally: Yes Pulse Check Posterior Tibialis and Dorsalis pulse intact bilaterally: Yes Comments No claudication Maceration R>L 4-5th interdigital space         Results for orders placed or performed in visit on 01/18/24  Vitamin B12   Collection Time: 01/18/24  8:41 AM  Result Value Ref Range   Vitamin B-12 >1537 (H) 211 - 911 pg/mL  Comprehensive metabolic panel   Collection Time: 01/18/24  8:41 AM  Result Value Ref Range   Sodium 140 135 - 145 mEq/L   Potassium 3.9 3.5 - 5.1 mEq/L   Chloride 106 96 - 112 mEq/L   CO2 27 19 - 32 mEq/L   Glucose, Bld 145 (H) 70 - 99 mg/dL   BUN 11 6 - 23 mg/dL   Creatinine, Ser 1.61 0.40 - 1.50 mg/dL   Total Bilirubin 0.6 0.2 - 1.2 mg/dL   Alkaline Phosphatase 63 39 - 117 U/L   AST 16 0 - 37 U/L   ALT 10 0 - 53 U/L   Total Protein 6.6 6.0 - 8.3 g/dL   Albumin 3.8 3.5 - 5.2 g/dL   GFR 09.60 >45.40 mL/min   Calcium 8.4 8.4 - 10.5 mg/dL  Lipid panel   Collection Time: 01/18/24  8:41  AM  Result Value Ref Range   Cholesterol 135 0 - 200 mg/dL   Triglycerides 981.1 0.0 - 149.0 mg/dL   HDL 91.47 (L) >82.95 mg/dL   VLDL 62.1 0.0 - 30.8 mg/dL   LDL Cholesterol 74 0 - 99 mg/dL   Total CHOL/HDL Ratio 4    NonHDL 98.31   Microalbumin / creatinine urine ratio   Collection Time: 01/18/24  8:41 AM  Result Value Ref Range   Microalb, Ur 1.0 0.0 - 1.9 mg/dL   Creatinine,U 657.8 mg/dL   Microalb Creat Ratio 6.3 0.0 - 30.0 mg/g  Hemoglobin A1c   Collection Time: 01/18/24  8:41 AM  Result Value Ref Range   Hgb A1c MFr Bld 7.2 (H) 4.6 - 6.5 %   Lab Results  Component Value Date   PSA 1.29 01/06/2023   PSA 1.02 12/31/2021   PSA 0.65 12/24/2020   Assessment & Plan:   Problem List Items Addressed This Visit     Health care maintenance (Chronic)   Preventative protocols reviewed and updated unless pt declined. Discussed healthy diet and lifestyle.       Advanced care planning/counseling discussion (Chronic)   Previously discussed      Medicare annual wellness visit, subsequent - Primary (Chronic)   I have personally reviewed the Medicare Annual Wellness questionnaire and have noted 1. The patient's medical and social history 2. Their use of alcohol, tobacco or illicit drugs 3. Their current  medications and supplements 4. The patient's functional ability including ADL's, fall risks, home safety risks and hearing or visual impairment. Cognitive function has been assessed and addressed as indicated.  5. Diet and physical activity 6. Evidence for depression or mood disorders The patients weight, height, BMI have been recorded in the chart. I have made referrals, counseling and provided education to the patient based on review of the above and I have provided the pt with a written personalized care plan for preventive services. Provider list updated.. See scanned questionairre as needed for further documentation. Reviewed preventative protocols and updated unless pt  declined.       Hyperlipidemia associated with type 2 diabetes mellitus (HCC)   Chronic, stable on simvastatin - continue current regimen. The 10-year ASCVD risk score (Arnett DK, et al., 2019) is: 23%   Values used to calculate the score:     Age: 69 years     Sex: Male     Is Non-Hispanic African American: Yes     Diabetic: Yes     Tobacco smoker: No     Systolic Blood Pressure: 122 mmHg     Is BP treated: No     HDL Cholesterol: 36.9 mg/dL     Total Cholesterol: 135 mg/dL       Relevant Medications   simvastatin (ZOCOR) 20 MG tablet   Type 2 diabetes mellitus with other specified complication (HCC)   Chronic, deteriorated with A1c >7. Encouraged renewed efforts towards low sugar low carb diabetic diet and regular physical activity for goal weight loss, nutrition handout provided. He declines medication at this time. RTC 6 mo labs only.       Relevant Medications   simvastatin (ZOCOR) 20 MG tablet   Other Relevant Orders   Hemoglobin A1c   Ex-smoker   Quit smoking 20 yrs ago      Coronary artery calcification   Consider coronary calcium score. Continue simvastatin.       Relevant Medications   simvastatin (ZOCOR) 20 MG tablet   Emphysema lung (HCC)   Incidental finding, pt asxs off respiratory medication      Tinea pedis   Persistent maceration to R 4th5/th interdigital area. Discussed foot care.       Hypervitaminosis   Update B12 in 6 months after stopping oral B12 replacement      Relevant Orders   Vitamin B12   Other Visit Diagnoses       Need for influenza vaccination       Relevant Orders   Flu Vaccine Trivalent High Dose (Fluad) (Completed)     Special screening for malignant neoplasm of prostate       Relevant Orders   PSA     Special screening for malignant neoplasms, colon       Relevant Orders   Ambulatory referral to Gastroenterology        Meds ordered this encounter  Medications   simvastatin (ZOCOR) 20 MG tablet    Sig: Take  1 tablet (20 mg total) by mouth daily at 6 PM.    Dispense:  90 tablet    Refill:  4    Please send a replace/new response with 100-Day Supply if appropriate to maximize member benefit. Requesting 1 year supply.    Orders Placed This Encounter  Procedures   Flu Vaccine Trivalent High Dose (Fluad)   PSA    Standing Status:   Future    Expiration Date:   01/25/2025   Hemoglobin A1c  Standing Status:   Future    Expiration Date:   01/25/2025   Vitamin B12    Standing Status:   Future    Expiration Date:   01/25/2025   Ambulatory referral to Gastroenterology    Referral Priority:   Routine    Referral Type:   Consultation    Referral Reason:   Specialty Services Required    Number of Visits Requested:   1    Patient Instructions  I will refer you back to [facility] to discuss repeat colonoscopy  Work on low sugar low carb diabetic diet, handout provided Good to see you today Return as needed or in 1 year for next physical/wellness visit Return in 6 months for lab visit only   Follow up plan: Return in about 6 months (around 07/28/2024) for follow up visit.  Eustaquio Boyden, MD

## 2024-01-26 NOTE — Patient Instructions (Addendum)
 I will refer you back to [facility] to discuss repeat colonoscopy  Work on low sugar low carb diabetic diet, handout provided Good to see you today Return as needed or in 1 year for next physical/wellness visit Return in 6 months for lab visit only

## 2024-01-26 NOTE — Assessment & Plan Note (Signed)
 Previously discussed.

## 2024-01-27 DIAGNOSIS — E678 Other specified hyperalimentation: Secondary | ICD-10-CM | POA: Insufficient documentation

## 2024-01-27 NOTE — Assessment & Plan Note (Signed)
 Update B12 in 6 months after stopping oral B12 replacement

## 2024-01-27 NOTE — Assessment & Plan Note (Addendum)
 Chronic, deteriorated with A1c >7. Encouraged renewed efforts towards low sugar low carb diabetic diet and regular physical activity for goal weight loss, nutrition handout provided. He declines medication at this time. RTC 6 mo labs only.

## 2024-01-27 NOTE — Assessment & Plan Note (Signed)
 Persistent maceration to R 4th5/th interdigital area. Discussed foot care.

## 2024-01-27 NOTE — Assessment & Plan Note (Signed)
 Consider coronary calcium score. Continue simvastatin.

## 2024-01-27 NOTE — Assessment & Plan Note (Signed)
Quit smoking >20 yrs ago

## 2024-01-27 NOTE — Assessment & Plan Note (Signed)
 Incidental finding, pt asxs off respiratory medication

## 2024-02-16 ENCOUNTER — Ambulatory Visit (INDEPENDENT_AMBULATORY_CARE_PROVIDER_SITE_OTHER): Payer: Medicare Other

## 2024-02-16 VITALS — Ht 69.0 in | Wt 187.0 lb

## 2024-02-16 DIAGNOSIS — Z0000 Encounter for general adult medical examination without abnormal findings: Secondary | ICD-10-CM | POA: Diagnosis not present

## 2024-02-16 NOTE — Patient Instructions (Addendum)
 Edward Lester , Thank you for taking time to come for your Medicare Wellness Visit. I appreciate your ongoing commitment to your health goals. Please review the following plan we discussed and let me know if I can assist you in the future.   Referrals/Orders/Follow-Ups/Clinician Recommendations: none  This is a list of the screening recommended for you and due dates:  Health Maintenance  Topic Date Due   COVID-19 Vaccine (4 - 2024-25 season) 07/12/2023   Colon Cancer Screening  01/05/2024   Eye exam for diabetics  03/12/2024   Flu Shot  06/10/2024   Hemoglobin A1C  07/20/2024   Yearly kidney function blood test for diabetes  01/17/2025   Yearly kidney health urinalysis for diabetes  01/17/2025   Complete foot exam   01/25/2025   Medicare Annual Wellness Visit  02/15/2025   DTaP/Tdap/Td vaccine (2 - Td or Tdap) 12/08/2026   Pneumonia Vaccine  Completed   Hepatitis C Screening  Completed   Zoster (Shingles) Vaccine  Completed   HPV Vaccine  Aged Out    Advanced directives: (In Chart) A copy of your advanced directives are scanned into your chart should your provider ever need it.  Next Medicare Annual Wellness Visit scheduled for next year: Yes 02/16/25 @ 2:20pm televisit

## 2024-02-16 NOTE — Progress Notes (Signed)
 Subjective:   Edward Lester is a 77 y.o. who presents for a Medicare Wellness preventive visit.  Visit Complete: Virtual I connected with  Arif Show on 02/16/24 by a audio enabled telemedicine application and verified that I am speaking with the correct person using two identifiers.  Patient Location: Home  Provider Location: Office/Clinic  I discussed the limitations of evaluation and management by telemedicine. The patient expressed understanding and agreed to proceed.  Vital Signs: Because this visit was a virtual/telehealth visit, some criteria may be missing or patient reported. Any vitals not documented were not able to be obtained and vitals that have been documented are patient reported.  VideoDeclined- This patient declined Librarian, academic. Therefore the visit was completed with audio only.  Persons Participating in Visit: Patient.  AWV Questionnaire: Yes: Patient Medicare AWV questionnaire was completed by the patient on 02/13/24; I have confirmed that all information answered by patient is correct and no changes since this date.  Cardiac Risk Factors include: advanced age (>48men, >3 women);dyslipidemia;male gender     Objective:    Today's Vitals   02/16/24 1429  Weight: 187 lb (84.8 kg)  Height: 5\' 9"  (1.753 m)   Body mass index is 27.62 kg/m.     02/16/2024    2:37 PM 01/12/2023    3:24 PM 08/27/2020    9:00 AM 01/17/2020    1:29 PM 01/04/2019    9:03 AM  Advanced Directives  Does Patient Have a Medical Advance Directive? Yes Yes No Yes Yes  Type of Estate agent of [place];Living will Healthcare Power of [place];Living will  Living will;Healthcare Power of [employer] Power of [place];Living will  Does patient want to make changes to medical advance directive?  No - Patient declined  No - Patient declined   Copy of Healthcare Power of Attorney in Chart? Yes - validated most recent copy scanned in  chart (See row information) Yes - validated most recent copy scanned in chart (See row information)   Yes - validated most recent copy scanned in chart (See row information)  Would patient like information on creating a medical advance directive?   No - Patient declined      Current Medications (verified) Outpatient Encounter Medications as of 02/16/2024  Medication Sig   simvastatin (ZOCOR) 20 MG tablet Take 1 tablet (20 mg total) by mouth daily at 6 PM.   No facility-administered encounter medications on file as of 02/16/2024.    Allergies (verified) Patient has no known allergies.   History: Past Medical History:  Diagnosis Date   Aortic atherosclerosis (HCC) 12/29/2016   By CT   CAD (coronary artery disease) 12/29/2016   By CT   Diabetes mellitus type 2, controlled, without complications (HCC) 11/11/2014   ED (erectile dysfunction) of organic origin    Emphysema lung (HCC) 12/29/2016   Moderate changes of centrilobular and paraseptal emphysema identified. Diffuse bronchial wall thickening noted. By CT 12/2016   Ex-smoker quit ~2005   30 PY hx   History of colonic polyps    HLD (hyperlipidemia)    Osteoarthritis of knee 2015   severe predominantly R   Refusal of blood transfusions as patient is Jehovah's Witness    Past Surgical History:  Procedure Laterality Date   CARPAL TUNNEL RELEASE Left 2013   COLONOSCOPY  2009   1 hyperplastic polyp [facility])   COLONOSCOPY WITH PROPOFOL N/A 01/04/2019   TAx2, HPx1, diverticulosis, rpt 5 yrs Servando Snare, [name], MD)  FOOT SURGERY Left 2014   bone seperation on little toe   LIPOMA EXCISION  2005   right back   Family History  Problem Relation Age of Onset   Lung cancer Mother 53       (smoker)   Stomach cancer Father 74       (smoker)   Thyroid disease Sister    CAD Neg Hx    Stroke Neg Hx    Diabetes Neg Hx    Social History   Socioeconomic History   Marital status: Married    Spouse name: Not on file   Number of children:  4   Years of education: Not on file   Highest education level: Not on file  Occupational History   Occupation: sales rep  Tobacco Use   Smoking status: Former    Current packs/day: 0.00    Average packs/day: 1 pack/day for 37.0 years (37.0 ttl pk-yrs)    Types: Cigarettes    Start date: 11/11/1967    Quit date: 11/09/2004    Years since quitting: 19.2   Smokeless tobacco: Never  Vaping Use   Vaping status: Never Used  Substance and Sexual Activity   Alcohol use: Yes    Alcohol/week: 2.0 standard drinks of alcohol    Types: 1 Cans of beer, 1 Glasses of wine per week    Comment: social   Drug use: No   Sexual activity: Not on file  Other Topics Concern   Not on file  Social History Narrative   Lives with wife   Grown children   Jehova's witness   Occupation: Pharmacist, community rep - Lobbyist   Edu: college   Activity: golfing   Diet: good water, fruits/vegetables daily   Social Drivers of Corporate investment banker Strain: Low Risk  (02/16/2024)   Overall Financial Resource Strain (CARDIA)    Difficulty of Paying Living Expenses: Not hard at all  Food Insecurity: No Food Insecurity (02/16/2024)   Hunger Vital Sign    Worried About Running Out of Food in the Last Year: Never true    Ran Out of Food in the Last Year: Never true  Transportation Needs: No Transportation Needs (02/16/2024)   PRAPARE - Administrator, Civil Service (Medical): No    Lack of Transportation (Non-Medical): No  Physical Activity: Sufficiently Active (02/16/2024)   Exercise Vital Sign    Days of Exercise per Week: 3 days    Minutes of Exercise per Session: 150+ min  Stress: No Stress Concern Present (02/16/2024)   [employer] of Occupational Health - Occupational Stress Questionnaire    Feeling of Stress : Not at all  Social Connections: Moderately Integrated (02/16/2024)   Social Connection and Isolation Panel [NHANES]    Frequency of Communication with Friends and Family:  More than three times a week    Frequency of Social Gatherings with Friends and Family: More than three times a week    Attends Religious Services: More than 4 times per year    Active Member of [employer] or Organizations: No    Attends Banker Meetings: Never    Marital Status: Married    Tobacco Counseling Counseling given: Not Answered  Clinical Intake:  Pre-visit preparation completed: Yes  Pain : No/denies pain    BMI - recorded: 27.62 Nutritional Status: BMI 25 -29 Overweight Nutritional Risks: None Diabetes: No  Lab Results  Component Value Date   HGBA1C 7.2 (H) 01/18/2024  HGBA1C 6.7 (A) 07/16/2023   HGBA1C 6.9 (H) 01/06/2023     How often do you need to have someone help you when you read instructions, pamphlets, or other written materials from your doctor or pharmacy?: 1 - Never  Interpreter Needed?: No  Comments: lives with wife Information entered by :: B.Ngina Royer,LPN   Activities of Daily Living     02/13/2024    8:05 AM  In your present state of health, do you have any difficulty performing the following activities:  Hearing? 0  Vision? 0  Difficulty concentrating or making decisions? 0  Walking or climbing stairs? 0  Dressing or bathing? 0  Doing errands, shopping? 0  Preparing Food and eating ? N  In the past six months, have you accidently leaked urine? N  Do you have problems with loss of bowel control? N  Managing your Medications? N  Managing your Finances? N  Housekeeping or managing your Housekeeping? N    Patient Care Team: Eustaquio Boyden, MD as PCP - General (Family Medicine)  Indicate any recent Medical Services you may have received from other than [facility] providers in the past year (date may be approximate).     Assessment:   This is a routine wellness examination for Edward Lester.  Hearing/Vision screen Hearing Screening - Comments:: Pt says his hearing is good Vision Screening - Comments:: Pt says his vision is good:  rt cataract surgery @[facility] Dr Augustine Radar next in May   Goals Addressed             This Visit's Progress    COMPLETED: Patient Stated       Lose 8-10 lbs by October        Depression Screen     02/16/2024    2:34 PM 01/26/2024    3:50 PM 01/13/2023   10:47 AM 01/12/2023    3:23 PM 01/07/2022    2:02 PM 03/25/2021    8:26 AM 01/17/2020    1:29 PM  PHQ 2/9 Scores  PHQ - 2 Score 0 0 0 0 0 0 0  PHQ- 9 Score      0     Fall Risk     02/13/2024    8:05 AM 01/26/2024    3:50 PM 01/13/2023   10:47 AM 01/12/2023    3:25 PM 01/07/2022    2:02 PM  Fall Risk   Falls in the past year? 0 0 0 0 0  Number falls in past yr: 0   0   Injury with Fall? 0   0   Risk for fall due to : No Fall Risks   Impaired vision   Follow up Education provided;Falls prevention discussed   Falls prevention discussed     MEDICARE RISK AT HOME:  Medicare Risk at Home Any stairs in or around the home?: (Patient-Rptd) No If so, are there any without handrails?: (Patient-Rptd) No Home free of loose throw rugs in walkways, pet beds, electrical cords, etc?: (Patient-Rptd) Yes Adequate lighting in your home to reduce risk of falls?: (Patient-Rptd) No Life alert?: (Patient-Rptd) No Use of a cane, walker or w/c?: (Patient-Rptd) No Grab bars in the bathroom?: (Patient-Rptd) No Shower chair or bench in shower?: (Patient-Rptd) No Elevated toilet seat or a handicapped toilet?: (Patient-Rptd) No  TIMED UP AND GO:  Was the test performed?  No  Cognitive Function: 6CIT completed        02/16/2024    2:38 PM 01/12/2023    3:26 PM  6CIT  Screen  What Year? 0 points 0 points  What month? 0 points 0 points  What time? 0 points 0 points  Count back from 20 0 points 0 points  Months in reverse 0 points 0 points  Repeat phrase 0 points 0 points  Total Score 0 points 0 points    Immunizations Immunization History  Administered Date(s) Administered   Fluad Quad(high Dose 65+) 10/31/2019, 12/31/2020, 01/07/2022,  01/13/2023   Fluad Trivalent(High Dose 65+) 01/26/2024   Influenza,inj,Quad PF,6+ Mos 07/19/2014, 12/03/2015, 07/07/2016, 11/30/2017, 12/15/2018   Moderna Sars-Covid-2 Vaccination 01/27/2020, 02/29/2020, 10/10/2020   Pneumococcal Conjugate-13 05/16/2015   Pneumococcal Polysaccharide-23 08/15/2008, 11/28/2013   Tdap 12/08/2016   Zoster Recombinant(Shingrix) 12/28/2018, 05/27/2019   Zoster, Live 07/10/2007    Screening Tests Health Maintenance  Topic Date Due   COVID-19 Vaccine (4 - 2024-25 season) 07/12/2023   Colonoscopy  01/05/2024   OPHTHALMOLOGY EXAM  03/12/2024   INFLUENZA VACCINE  06/10/2024   HEMOGLOBIN A1C  07/20/2024   Diabetic kidney evaluation - eGFR measurement  01/17/2025   Diabetic kidney evaluation - Urine ACR  01/17/2025   FOOT EXAM  01/25/2025   Medicare Annual Wellness (AWV)  02/15/2025   DTaP/Tdap/Td (2 - Td or Tdap) 12/08/2026   Pneumonia Vaccine 63+ Years old  Completed   Hepatitis C Screening  Completed   Zoster Vaccines- Shingrix  Completed   HPV VACCINES  Aged Out    Health Maintenance  Health Maintenance Due  Topic Date Due   COVID-19 Vaccine (4 - 2024-25 season) 07/12/2023   Colonoscopy  01/05/2024   Health Maintenance Items Addressed: None needed  Additional Screening:  Vision Screening: Recommended annual ophthalmology exams for early detection of glaucoma and other disorders of the eye.  Dental Screening: Recommended annual dental exams for proper oral hygiene  Community Resource Referral / Chronic Care Management: CRR required this visit?  No   CCM required this visit?  No    Plan:     I have personally reviewed and noted the following in the patient's chart:   Medical and social history Use of alcohol, tobacco or illicit drugs  Current medications and supplements including opioid prescriptions. Patient is not currently taking opioid prescriptions. Functional ability and status Nutritional status Physical activity Advanced  directives List of other physicians Hospitalizations, surgeries, and ER visits in previous 12 months Vitals Screenings to include cognitive, depression, and falls Referrals and appointments  In addition, I have reviewed and discussed with patient certain preventive protocols, quality metrics, and best practice recommendations. A written personalized care plan for preventive services as well as general preventive health recommendations were provided to patient.    Sue Lush, LPN   11/15/1094   After Visit Summary: (MyChart) Due to this being a telephonic visit, the after visit summary with patients personalized plan was offered to patient via MyChart   Notes: Nothing significant to report at this time.

## 2024-02-17 NOTE — Addendum Note (Signed)
 Addended by: Eustaquio Boyden on: 02/17/2024 08:05 AM   Modules accepted: Orders

## 2024-03-14 ENCOUNTER — Encounter: Payer: Self-pay | Admitting: Family Medicine

## 2024-03-14 DIAGNOSIS — H2512 Age-related nuclear cataract, left eye: Secondary | ICD-10-CM | POA: Diagnosis not present

## 2024-03-14 DIAGNOSIS — E119 Type 2 diabetes mellitus without complications: Secondary | ICD-10-CM | POA: Diagnosis not present

## 2024-03-14 LAB — HM DIABETES EYE EXAM

## 2024-07-28 ENCOUNTER — Other Ambulatory Visit

## 2024-07-29 ENCOUNTER — Other Ambulatory Visit (INDEPENDENT_AMBULATORY_CARE_PROVIDER_SITE_OTHER)

## 2024-07-29 DIAGNOSIS — E678 Other specified hyperalimentation: Secondary | ICD-10-CM | POA: Diagnosis not present

## 2024-07-29 DIAGNOSIS — E1169 Type 2 diabetes mellitus with other specified complication: Secondary | ICD-10-CM | POA: Diagnosis not present

## 2024-07-29 DIAGNOSIS — Z125 Encounter for screening for malignant neoplasm of prostate: Secondary | ICD-10-CM | POA: Diagnosis not present

## 2024-07-29 LAB — VITAMIN B12: Vitamin B-12: 437 pg/mL (ref 211–911)

## 2024-07-29 LAB — PSA: PSA: 1.16 ng/mL (ref 0.10–4.00)

## 2024-07-29 LAB — HEMOGLOBIN A1C: Hgb A1c MFr Bld: 7.4 % — ABNORMAL HIGH (ref 4.6–6.5)

## 2024-08-01 ENCOUNTER — Ambulatory Visit: Payer: Self-pay | Admitting: Family Medicine

## 2024-08-05 ENCOUNTER — Encounter: Payer: Self-pay | Admitting: Family Medicine

## 2024-08-05 ENCOUNTER — Ambulatory Visit: Admitting: Family Medicine

## 2024-08-05 VITALS — BP 118/70 | HR 69 | Temp 99.0°F | Ht 69.0 in | Wt 188.4 lb

## 2024-08-05 DIAGNOSIS — Z23 Encounter for immunization: Secondary | ICD-10-CM

## 2024-08-05 DIAGNOSIS — E1169 Type 2 diabetes mellitus with other specified complication: Secondary | ICD-10-CM | POA: Diagnosis not present

## 2024-08-05 DIAGNOSIS — Z7984 Long term (current) use of oral hypoglycemic drugs: Secondary | ICD-10-CM

## 2024-08-05 DIAGNOSIS — E785 Hyperlipidemia, unspecified: Secondary | ICD-10-CM

## 2024-08-05 MED ORDER — BLOOD GLUCOSE MONITORING SUPPL DEVI
1.0000 | Freq: Three times a day (TID) | 0 refills | Status: AC
Start: 2024-08-05 — End: ?

## 2024-08-05 MED ORDER — BLOOD GLUCOSE TEST VI STRP: 1.0000 | ORAL_STRIP | Freq: Three times a day (TID) | 0 refills | Status: AC

## 2024-08-05 MED ORDER — LANCETS MISC. MISC: 1.0000 | Freq: Three times a day (TID) | 0 refills | Status: AC

## 2024-08-05 MED ORDER — LANCET DEVICE MISC: 1.0000 | Freq: Three times a day (TID) | 0 refills | Status: AC

## 2024-08-05 MED ORDER — METFORMIN HCL 500 MG PO TABS: 500.0000 mg | ORAL_TABLET | Freq: Every day | ORAL | 1 refills | Status: AC

## 2024-08-05 NOTE — Assessment & Plan Note (Addendum)
 Chronic, remains above goal.  Start metformin  500mg  once daily.  Glucometer sent to pharmacy. Reviewed goal sugar levels. Recommend choosing lower glycemic index fruits.  Discussed alcohol intake.  RTC 3 months DM f/u visit

## 2024-08-05 NOTE — Progress Notes (Signed)
 Ph: [phone] Fax: [phone]   Patient ID: Edward Lester, male    DOB: [birth year], 78 y.o.   MRN: [id number]  This visit was conducted in person.  BP 118/70   Pulse 69   Temp 99 F (37.2 C) (Oral)   Ht 5' 9 (1.753 m)   Wt 188 lb 6 oz (85.4 kg)   SpO2 94%   BMI 27.82 kg/m    CC: f/u 6 months Subjective:   HPI: Edward Lester is a 78 y.o. male presenting on 08/05/2024 for Medical Management of Chronic Issues (Pt here for OV f/)   HLD - continues simvastatin   DM - does not regularly check sugars. Compliant with antihyperglycemic regimen which includes: diet controlled. Denies low sugars or hypoglycemic symptoms. Denies blurry vision. Isolated episode of tingling to left arm, has since resolved. Last diabetic eye exam 03/2024 ([facility] [place] - Dr Jacquie OD - no DR. Glucometer brand: doesn't have - sent to pharmacy. Last foot exam: 01/2024. DSME: refresher course 01/2020. Declines return at this time.  Lab Results  Component Value Date   HGBA1C 7.4 (H) 07/29/2024   Diabetic Foot Exam - Simple   Simple Foot Form Diabetic Foot exam was performed with the following findings: Yes 08/05/2024  9:03 AM  Visual Inspection No deformities, no ulcerations, no other skin breakdown bilaterally: Yes Sensation Testing Intact to touch and monofilament testing bilaterally: Yes Pulse Check Posterior Tibialis and Dorsalis pulse intact bilaterally: Yes Comments No claudication Some maceration to interdigital area between 4th/5th toes    Lab Results  Component Value Date   MICROALBUR 1.0 01/18/2024         Relevant past medical, surgical, family and social history reviewed and updated as indicated. Interim medical history since our last visit reviewed. Allergies and medications reviewed and updated. Outpatient Medications Prior to Visit  Medication Sig Dispense Refill   simvastatin  (ZOCOR ) 20 MG tablet Take 1 tablet (20 mg total) by mouth daily at 6 PM. 90 tablet 4    No facility-administered medications prior to visit.     Per HPI unless specifically indicated in ROS section below Review of Systems  Objective:  BP 118/70   Pulse 69   Temp 99 F (37.2 C) (Oral)   Ht 5' 9 (1.753 m)   Wt 188 lb 6 oz (85.4 kg)   SpO2 94%   BMI 27.82 kg/m   Wt Readings from Last 3 Encounters:  08/05/24 188 lb 6 oz (85.4 kg)  02/16/24 187 lb (84.8 kg)  01/26/24 187 lb 4 oz (84.9 kg)      Physical Exam Vitals and nursing note reviewed.  Constitutional:      Appearance: Normal appearance. He is not ill-appearing.  HENT:     Head: Normocephalic and atraumatic.     Mouth/Throat:     Mouth: Mucous membranes are moist.     Pharynx: Oropharynx is clear. No oropharyngeal exudate or posterior oropharyngeal erythema.  Eyes:     Extraocular Movements: Extraocular movements intact.     Conjunctiva/sclera: Conjunctivae normal.     Pupils: Pupils are equal, round, and reactive to light.  Cardiovascular:     Rate and Rhythm: Normal rate and regular rhythm.     Pulses: Normal pulses.     Heart sounds: Normal heart sounds. No murmur heard. Pulmonary:     Effort: Pulmonary effort is normal. No respiratory distress.     Breath sounds: Normal breath sounds. No wheezing, rhonchi or rales.  Musculoskeletal:     Right lower leg: No edema.     Left lower leg: No edema.     Comments: See HPI for foot exam if done  Skin:    General: Skin is warm and dry.     Findings: No rash.  Neurological:     Mental Status: He is alert.  Psychiatric:        Mood and Affect: Mood normal.        Behavior: Behavior normal.       Results for orders placed or performed in visit on 07/29/24  Vitamin B12   Collection Time: 07/29/24  8:34 AM  Result Value Ref Range   Vitamin B-12 437 211 - 911 pg/mL  Hemoglobin A1c   Collection Time: 07/29/24  8:34 AM  Result Value Ref Range   Hgb A1c MFr Bld 7.4 (H) 4.6 - 6.5 %  PSA   Collection Time: 07/29/24  8:34 AM  Result Value Ref Range    PSA 1.16 0.10 - 4.00 ng/mL    Assessment & Plan:  # provided to [facility] to schedule for colonoscopy as due.   Problem List Items Addressed This Visit     Hyperlipidemia associated with type 2 diabetes mellitus (HCC)   Chronic, stable period on simvastatin  since prior to 2015 The 10-year ASCVD risk score (Arnett DK, et al., 2019) is: 22.5%   Values used to calculate the score:     Age: 66 years     Clincally relevant sex: Male     Is Non-Hispanic African American: Yes     Diabetic: Yes     Tobacco smoker: No     Systolic Blood Pressure: 118 mmHg     Is BP treated: No     HDL Cholesterol: 36.9 mg/dL     Total Cholesterol: 135 mg/dL       Relevant Medications   metFORMIN  (GLUCOPHAGE ) 500 MG tablet   Type 2 diabetes mellitus with other specified complication (HCC) - Primary   Chronic, remains above goal.  Start metformin  500mg  once daily.  Glucometer sent to pharmacy. Reviewed goal sugar levels. Recommend choosing lower glycemic index fruits.  Discussed alcohol intake.  RTC 3 months DM f/u visit       Relevant Medications   metFORMIN  (GLUCOPHAGE ) 500 MG tablet   Blood Glucose Monitoring Suppl DEVI   Glucose Blood (BLOOD GLUCOSE TEST STRIPS) STRP   Lancet Device MISC   Lancets Misc. MISC   Other Visit Diagnoses       Encounter for immunization       Relevant Orders   Flu vaccine HIGH DOSE PF(Fluzone Trivalent) (Completed)        Meds ordered this encounter  Medications   metFORMIN  (GLUCOPHAGE ) 500 MG tablet    Sig: Take 1 tablet (500 mg total) by mouth daily with breakfast.    Dispense:  90 tablet    Refill:  1   Blood Glucose Monitoring Suppl DEVI    Sig: 1 each by Does not apply route in the morning, at noon, and at bedtime. May substitute to any manufacturer covered by patient's insurance.    Dispense:  1 each    Refill:  0   Glucose Blood (BLOOD GLUCOSE TEST STRIPS) STRP    Sig: 1 each by Does not apply route in the morning, at noon, and at bedtime.  May substitute to any manufacturer covered by patient's insurance.    Dispense:  100 strip    Refill:  0   Lancet Device MISC    Sig: 1 each by Does not apply route in the morning, at noon, and at bedtime. May substitute to any manufacturer covered by patient's insurance.    Dispense:  1 each    Refill:  0   Lancets Misc. MISC    Sig: 1 each by Does not apply route in the morning, at noon, and at bedtime. May substitute to any manufacturer covered by patient's insurance.    Dispense:  100 each    Refill:  0    Orders Placed This Encounter  Procedures   Flu vaccine HIGH DOSE PF(Fluzone Trivalent)    Patient Instructions  You may call [facility] at [phone] to schedule an appointment for colonoscopy as you're due.  Choose low glycemic index fruits  Start metformin  500mg  daily.  I've sent glucose meter to our pharmacy to have on hand to check sugars as needed Goal fasting sugar 80-120 Goal sugars 2 hours after meal <180 Too low if sugar <70   Good to see you today Return in 3 months for diabetes follow up visit     Follow up plan: Return in about 3 months (around 11/04/2024), or if symptoms worsen or fail to improve, for follow up visit.  Anton Blas, MD

## 2024-08-05 NOTE — Assessment & Plan Note (Addendum)
 Chronic, stable period on simvastatin  since prior to 2015 The 10-year ASCVD risk score (Arnett DK, et al., 2019) is: 22.5%   Values used to calculate the score:     Age: 78 years     Clincally relevant sex: Male     Is Non-Hispanic African American: Yes     Diabetic: Yes     Tobacco smoker: No     Systolic Blood Pressure: 118 mmHg     Is BP treated: No     HDL Cholesterol: 36.9 mg/dL     Total Cholesterol: 135 mg/dL

## 2024-08-05 NOTE — Patient Instructions (Addendum)
 You may call [facility] at [phone] to schedule an appointment for colonoscopy as you're due.  Choose low glycemic index fruits  Start metformin  500mg  daily.  I've sent glucose meter to our pharmacy to have on hand to check sugars as needed Goal fasting sugar 80-120 Goal sugars 2 hours after meal <180 Too low if sugar <70   Good to see you today Return in 3 months for diabetes follow up visit

## 2024-08-23 NOTE — Progress Notes (Unsigned)
 08/24/2024 Edward Lester [id number] May 22, 1946  Gastroenterology Office Note    Referring Provider: Rilla Baller, MD Primary Care Physician:  Rilla Baller, MD  Primary GI Provider: Jinny Carmine, MD    Chief Complaint   Chief Complaint  Patient presents with   Other    Hx polyps- no issues today     History of Present Illness   Edward Lester is a 78 y.o. male presenting today at the request of Rilla Baller, MD to discuss need for colonoscopy.  Patient was seen recently by PCP and referred for colonoscopy. His last colonoscopy was in 2020 with 5 year recall. He states he is due but was concerned if he needed another colonoscpy due to his age.  He denies any GI concerns. Has formed BM 1-2 times daily, denies melena or hematochezia. He reports that that he recently started on Metformin  due to HgbA1c 7.4, states no side effects or GI issues from metformin . Denies family history of colon cancer.   01/04/2019 Colonoscopy  - Two 2 to 3 mm polyps in the ascending colon, removed with a cold biopsy forceps. Resected and retrieved. TUBULAR ADENOMA (2). NEGATIVE FOR HIGH-GRADE DYSPLASIA AND MALIGNANCY.  - One 5 mm polyp in the sigmoid colon, removed with a cold biopsy forceps. Resected and retrieved. HYPERPLASTIC POLYP  - Diverticulosis in the sigmoid colon.  - Non-bleeding internal hemorrhoids. 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 Cataracts DSME refresher course at [facility] 01/2020 Last Assessment & Plan: Formatting of this note might be different from the original. Chronic, stable, diet controlled. Foot exam today.     Diabetes mellitus type 2, controlled, without  complications (HCC) 11/11/2014   ED (erectile dysfunction) of organic origin    Emphysema lung (HCC) 12/29/2016   Moderate changes of centrilobular and paraseptal emphysema identified. Diffuse bronchial wall thickening noted. By CT 12/2016   Ex-smoker quit ~2005   30 PY hx   History of colonic polyps    HLD (hyperlipidemia)    Osteoarthritis of knee 2015   severe predominantly R   Pseudophakia of right eye 08/11/2022   S/P Phaco/Traditional/IOL Right Eye with Alcon [id number]  22.0D,        , wound@180 , 08/11/2022, VLynk, ARRINGDON, Moxi, Sweep     Refusal of blood transfusions as patient is Jehovah's Witness    Transfusion of blood product refused for religious reason 08/08/2022    Past Surgical History:  Procedure Laterality Date   CARPAL TUNNEL RELEASE Left 2013   COLONOSCOPY  2009   1 hyperplastic polyp ([place] )   COLONOSCOPY WITH PROPOFOL  N/A 01/04/2019   TAx2, HPx1, diverticulosis, rpt 5 yrs Renne, [name], MD)   FOOT SURGERY Left 2014   bone seperation on little toe   LIPOMA EXCISION  2005   right back    Current Outpatient Medications  Medication Sig Dispense Refill   Blood Glucose Monitoring Suppl DEVI 1 each by Does not apply route in the morning, at noon, and at bedtime. May substitute to any manufacturer covered by patient's insurance. 1 each 0   Glucose Blood (BLOOD GLUCOSE TEST STRIPS) STRP 1 each by Does not apply route in the  morning, at noon, and at bedtime. May substitute to any manufacturer covered by patient's insurance. 100 strip 0   Lancet Device MISC 1 each by Does not apply route in the morning, at noon, and at bedtime. May substitute to any manufacturer covered by patient's insurance. 1 each 0   Lancets Misc. MISC 1 each by Does not apply route in the morning, at noon, and at bedtime. May substitute to any manufacturer covered by patient's insurance. 100 each 0   metFORMIN  (GLUCOPHAGE ) 500 MG tablet Take 1 tablet (500 mg total) by mouth daily with breakfast.  90 tablet 1   simvastatin  (ZOCOR ) 20 MG tablet Take 1 tablet (20 mg total) by mouth daily at 6 PM. 90 tablet 4   No current facility-administered medications for this visit.    Allergies as of 08/24/2024   (No Known Allergies)    Family History  Problem Relation Age of Onset   Lung cancer Mother 56       (smoker)   Stomach cancer Father 56       (smoker)   Thyroid disease Sister    CAD Neg Hx    Stroke Neg Hx    Diabetes Neg Hx     Social History   Socioeconomic History   Marital status: Married    Spouse name: Not on file   Number of children: 4   Years of education: Not on file   Highest education level: Not on file  Occupational History   Occupation: sales rep  Tobacco Use   Smoking status: Former    Current packs/day: 0.00    Average packs/day: 1 pack/day for 37.0 years (37.0 ttl pk-yrs)    Types: Cigarettes    Start date: 11/11/1967    Quit date: 11/09/2004    Years since quitting: 19.8   Smokeless tobacco: Never  Vaping Use   Vaping status: Never Used  Substance and Sexual Activity   Alcohol use: Yes    Alcohol/week: 2.0 standard drinks of alcohol    Types: 1 Cans of beer, 1 Glasses of wine per week    Comment: social   Drug use: No   Sexual activity: Not on file  Other Topics Concern   Not on file  Social History Narrative   Lives with wife   Grown children   Jehova's witness   Occupation: Pharmacist, community rep - Lobbyist   Edu: college   Activity: golfing   Diet: good water, fruits/vegetables daily   Social Drivers of Corporate investment banker Strain: Low Risk  (06/10/2024)   Received from [facility]   Overall Financial Resource Strain (CARDIA)    Difficulty of Paying Living Expenses: Not very hard  Food Insecurity: No Food Insecurity (06/10/2024)   Received from [facility]   Hunger Vital Sign    Within the past 12 months, you worried that your food would run out before you got the money to  buy more.: Never true    Within the past 12 months, the food you bought just didn't last and you didn't have money to get more.: Never true  Transportation Needs: No Transportation Needs (06/10/2024)   Received from [facility] - Transportation    In the past 12 months, has lack of transportation kept you from medical appointments or from getting medications?: No    Lack of Transportation (Non-Medical): No  Physical Activity: Sufficiently Active (02/16/2024)   Exercise Vital Sign  Days of Exercise per Week: 3 days    Minutes of Exercise per Session: 150+ min  Stress: No Stress Concern Present (02/16/2024)   [employer] of Occupational Health - Occupational Stress Questionnaire    Feeling of Stress : Not at all  Social Connections: Moderately Integrated (02/16/2024)   Social Connection and Isolation Panel    Frequency of Communication with Friends and Family: More than three times a week    Frequency of Social Gatherings with Friends and Family: More than three times a week    Attends Religious Services: More than 4 times per year    Active Member of Clubs or Organizations: No    Attends Banker Meetings: Never    Marital Status: Married  Catering manager Violence: Not At Risk (02/16/2024)   Humiliation, Afraid, Rape, and Kick questionnaire    Fear of Current or Ex-Partner: No    Emotionally Abused: No    Physically Abused: No    Sexually Abused: No     RELEVANT GI HISTORY, IMAGING AND LABS: CBC    Component Value Date/Time   WBC 6.6 08/27/2020 0838   RBC 4.83 08/27/2020 0838   HGB 14.7 08/27/2020 0838   HCT 43.7 08/27/2020 0838   PLT 252 08/27/2020 0838   MCV 90.5 08/27/2020 0838   MCH 30.4 08/27/2020 0838   MCHC 33.6 08/27/2020 0838   RDW 13.6 08/27/2020 0838   LYMPHSABS 2.3 12/15/2018 1113   MONOABS 0.5 12/15/2018 1113   EOSABS 0.1 12/15/2018 1113   BASOSABS 0.0 12/15/2018 1113   No results for input(s): HGB in the last 8760  hours.  CMP     Component Value Date/Time   NA 140 01/18/2024 0841   K 3.9 01/18/2024 0841   CL 106 01/18/2024 0841   CO2 27 01/18/2024 0841   GLUCOSE 145 (H) 01/18/2024 0841   BUN 11 01/18/2024 0841   CREATININE 1.07 01/18/2024 0841   CALCIUM  8.4 01/18/2024 0841   PROT 6.6 01/18/2024 0841   ALBUMIN 3.8 01/18/2024 0841   AST 16 01/18/2024 0841   ALT 10 01/18/2024 0841   ALKPHOS 63 01/18/2024 0841   BILITOT 0.6 01/18/2024 0841   GFRNONAA >60 08/27/2020 0838      Latest Ref Rng & Units 01/18/2024    8:41 AM 01/06/2023    8:12 AM 12/31/2021    8:28 AM  Hepatic Function  Total Protein 6.0 - 8.3 g/dL 6.6  6.8  7.3   Albumin 3.5 - 5.2 g/dL 3.8  3.7  4.1   AST 0 - 37 U/L 16  14  17    ALT 0 - 53 U/L 10  11  10    Alk Phosphatase 39 - 117 U/L 63  70  66   Total Bilirubin 0.2 - 1.2 mg/dL 0.6  0.6  0.6       Review of Systems   All systems reviewed and negative except where noted in HPI.    Physical Exam  BP 132/60   Pulse 60   Temp 98 F (36.7 C)   Ht 5' 9 (1.753 m)   Wt 190 lb 9.6 oz (86.5 kg)   SpO2 95%   BMI 28.15 kg/m  No LMP for male patient. General:   Alert and oriented. Pleasant and cooperative. Well-nourished and well-developed. NAD Head:  Normocephalic and atraumatic. Eyes:  Without icterus Ears:  Normal auditory acuity. Lungs:  Respirations even and unlabored.  Clear throughout to auscultation.   No wheezes, crackles, or rhonchi.  No acute distress. Heart:  Regular rate and rhythm; no murmurs, clicks, rubs, or gallops. Abdomen:  Normal bowel sounds.  No bruits.  Soft, non-tender and non-distended without masses, hepatosplenomegaly or hernias noted.  No guarding or rebound tenderness.   Rectal:  Deferred. Msk:  Symmetrical without gross deformities. Normal posture. Extremities:  Without edema. Neurologic:  Alert and  oriented x4;  grossly normal neurologically. Skin:  Intact without significant lesions or rashes. Psych:  Alert and cooperative. Normal mood  and affect.   Assessment & Plan   Edward Lester is a 78 y.o. male presenting today to discuss surveillance colonoscopy.  Last colonoscopy 01/05/2024 with Dr. Jinny with good bowel prep 2, 2 - 3 mm polyps in the ascending colon and one 5 mm polyp in the sigmoid colon.  Polyps were benign but precancerous. 5-year recommendation for repeat colonoscopy.  I discussed this with the patient and the risk of procedure.  He is in agreement to have colonoscopy at this time.  He will follow-up as needed.    Grayce Bohr, DNP, AGNP-C [facility]

## 2024-08-24 ENCOUNTER — Ambulatory Visit (INDEPENDENT_AMBULATORY_CARE_PROVIDER_SITE_OTHER): Admitting: Family Medicine

## 2024-08-24 ENCOUNTER — Telehealth: Payer: Self-pay

## 2024-08-24 ENCOUNTER — Encounter: Payer: Self-pay | Admitting: Family Medicine

## 2024-08-24 VITALS — BP 132/60 | HR 60 | Temp 98.0°F | Ht 69.0 in | Wt 190.6 lb

## 2024-08-24 DIAGNOSIS — Z860100 Personal history of colon polyps, unspecified: Secondary | ICD-10-CM

## 2024-08-24 DIAGNOSIS — Z860101 Personal history of adenomatous and serrated colon polyps: Secondary | ICD-10-CM

## 2024-08-24 DIAGNOSIS — Z860102 Personal history of hyperplastic colon polyps: Secondary | ICD-10-CM | POA: Diagnosis not present

## 2024-08-24 MED ORDER — NA SULFATE-K SULFATE-MG SULF 17.5-3.13-1.6 GM/177ML PO SOLN: 1.0000 | Freq: Once | ORAL | 0 refills | Status: DC

## 2024-08-24 NOTE — Telephone Encounter (Signed)
 Spoke with patient- scheduled colonoscopy 11/09/24 [place]-suprep sent to pharmacy. Mailed instructions.

## 2024-08-24 NOTE — Patient Instructions (Signed)
 I will call you when I have a cancellation or when our schedule opens up to schedule your colonoscopy.

## 2024-10-31 ENCOUNTER — Encounter: Payer: Self-pay | Admitting: Gastroenterology

## 2024-10-31 NOTE — Anesthesia Preprocedure Evaluation (Signed)
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

## 2024-11-04 ENCOUNTER — Ambulatory Visit: Admitting: Family Medicine

## 2024-11-07 NOTE — Telephone Encounter (Signed)
 Per Erminio, RN at [facility], He said he called the office before Christmas to cancel. He didn't give a date he wanted to reschedule and that he would be out of town 2 more weeks.   Thanks,  [place], CMA

## 2024-11-09 ENCOUNTER — Encounter: Admission: RE | Payer: Self-pay | Source: Home / Self Care

## 2024-11-09 ENCOUNTER — Encounter: Payer: Self-pay | Admitting: Anesthesiology

## 2024-11-09 ENCOUNTER — Ambulatory Visit: Admission: RE | Admit: 2024-11-09 | Admitting: Gastroenterology

## 2024-11-09 HISTORY — DX: Type 2 diabetes mellitus with other specified complication: E11.69

## 2024-11-09 SURGERY — COLONOSCOPY
Anesthesia: General | Laterality: Bilateral

## 2024-11-18 ENCOUNTER — Encounter: Payer: Self-pay | Admitting: Family Medicine

## 2024-11-18 ENCOUNTER — Ambulatory Visit: Admitting: Family Medicine

## 2024-11-18 VITALS — BP 140/80 | HR 78 | Temp 97.8°F | Ht 69.0 in | Wt 186.0 lb

## 2024-11-18 DIAGNOSIS — Z7984 Long term (current) use of oral hypoglycemic drugs: Secondary | ICD-10-CM | POA: Diagnosis not present

## 2024-11-18 DIAGNOSIS — E1169 Type 2 diabetes mellitus with other specified complication: Secondary | ICD-10-CM | POA: Diagnosis not present

## 2024-11-18 DIAGNOSIS — R030 Elevated blood-pressure reading, without diagnosis of hypertension: Secondary | ICD-10-CM | POA: Diagnosis not present

## 2024-11-18 LAB — POCT GLYCOSYLATED HEMOGLOBIN (HGB A1C): Hemoglobin A1C: 6.7 % — AB (ref 4.0–5.6)

## 2024-11-18 NOTE — Assessment & Plan Note (Signed)
 BP elevated in office today - he attributes to salty mexican soup last night.  Recommend low salt/sodium diet, reviewed diet choices to improve blood pressure control He will start monitoring BP at home with BP log sheet provided today and notify me if persistently >140/90 to consider medication.  Reassess at CPE in 3 months.

## 2024-11-18 NOTE — Progress Notes (Signed)
 " Ph: [phone] Fax: [phone]   Patient ID: Edward Lester, male    DOB: [birth year], 79 y.o.   MRN: [id number]  This visit was conducted in person.  BP (!) 140/80 (BP Location: Right Arm, Patient Position: Sitting, Cuff Size: Normal)   Pulse 78   Temp 97.8 F (36.6 C) (Oral)   Ht 5' 9 (1.753 m)   Wt 186 lb (84.4 kg)   SpO2 99%   BMI 27.47 kg/m   BP Readings from Last 3 Encounters:  11/18/24 (!) 140/80  08/24/24 132/60  08/05/24 118/70  154/80 on repeat   CC: DM f/u visit  Subjective:   HPI: Edward Lester is a 79 y.o. male presenting on 11/18/2024 for Medical Management of Chronic Issues (DM FU/Has been taking meds, hasn't been able to check sugar)   Due for colonoscopy - will call to schedule.   DM - does not regularly check sugars. Compliant with antihyperglycemic regimen which includes: metformin  500mg  daily. Denies low sugars or hypoglycemic symptoms. Denies paresthesias, blurry vision. Last diabetic eye exam 03/2024 ([facility]). Glucometer brand: accuchek guide. Last foot exam: 07/2024. DSME: refresher course 01/2020, declines repeat. Lab Results  Component Value Date   HGBA1C 6.7 (A) 11/18/2024   Diabetic Foot Exam - Simple   No data filed    Lab Results  Component Value Date   MICROALBUR 1.0 01/18/2024         Relevant past medical, surgical, family and social history reviewed and updated as indicated. Interim medical history since our last visit reviewed. Allergies and medications reviewed and updated. Outpatient Medications Prior to Visit  Medication Sig Dispense Refill   Blood Glucose Monitoring Suppl DEVI 1 each by Does not apply route in the morning, at noon, and at bedtime. May substitute to any manufacturer covered by patient's insurance. 1 each 0   metFORMIN  (GLUCOPHAGE ) 500 MG tablet Take 1 tablet (500 mg total) by mouth daily with breakfast. 90 tablet 1   simvastatin  (ZOCOR ) 20 MG tablet Take 1 tablet (20 mg total) by mouth daily at 6 PM.  90 tablet 4   No facility-administered medications prior to visit.     Per HPI unless specifically indicated in ROS section below Review of Systems  Objective:  BP (!) 140/80 (BP Location: Right Arm, Patient Position: Sitting, Cuff Size: Normal)   Pulse 78   Temp 97.8 F (36.6 C) (Oral)   Ht 5' 9 (1.753 m)   Wt 186 lb (84.4 kg)   SpO2 99%   BMI 27.47 kg/m   Wt Readings from Last 3 Encounters:  11/18/24 186 lb (84.4 kg)  08/24/24 190 lb 9.6 oz (86.5 kg)  08/05/24 188 lb 6 oz (85.4 kg)      Physical Exam Vitals and nursing note reviewed.  Constitutional:      Appearance: Normal appearance. He is not ill-appearing.  HENT:     Head: Normocephalic and atraumatic.     Mouth/Throat:     Mouth: Mucous membranes are moist.     Pharynx: Oropharynx is clear. No oropharyngeal exudate or posterior oropharyngeal erythema.  Eyes:     Extraocular Movements: Extraocular movements intact.     Conjunctiva/sclera: Conjunctivae normal.     Pupils: Pupils are equal, round, and reactive to light.  Cardiovascular:     Rate and Rhythm: Normal rate and regular rhythm.     Pulses: Normal pulses.     Heart sounds: Normal heart sounds. No murmur heard. Pulmonary:  Effort: Pulmonary effort is normal. No respiratory distress.     Breath sounds: Normal breath sounds. No wheezing, rhonchi or rales.  Musculoskeletal:     Cervical back: Normal range of motion and neck supple.     Right lower leg: No edema.     Left lower leg: No edema.     Comments: See HPI for foot exam if done  Skin:    General: Skin is warm and dry.     Findings: No rash.  Neurological:     Mental Status: He is alert.  Psychiatric:        Mood and Affect: Mood normal.        Behavior: Behavior normal.       Results for orders placed or performed in visit on 11/18/24  HgB A1c   Collection Time: 11/18/24  7:55 AM  Result Value Ref Range   Hemoglobin A1C 6.7 (A) 4.0 - 5.6 %   HbA1c POC (<> result, manual entry)      HbA1c, POC (prediabetic range)     HbA1c, POC (controlled diabetic range)     Lab Results  Component Value Date   CHOL 135 01/18/2024   HDL 36.90 (L) 01/18/2024   LDLCALC 74 01/18/2024   TRIG 121.0 01/18/2024   CHOLHDL 4 01/18/2024   Assessment & Plan:   Problem List Items Addressed This Visit     Type 2 diabetes mellitus with other specified complication (HCC) - Primary   Chronic, great control to date.  Continue low dose metformin  which is tolerated well.  He received cbg training by CMA today.       Relevant Orders   HgB A1c (Completed)   Elevated blood pressure reading in office without diagnosis of hypertension   BP elevated in office today - he attributes to salty mexican soup last night.  Recommend low salt/sodium diet, reviewed diet choices to improve blood pressure control He will start monitoring BP at home with BP log sheet provided today and notify me if persistently >140/90 to consider medication.  Reassess at CPE in 3 months.         No orders of the defined types were placed in this encounter.   Orders Placed This Encounter  Procedures   HgB A1c    Patient Instructions  Goal fasting sugar is 80-120 Goal sugar 2 hours after meal is <180 Sugars are doing well!  Return in 3 months for physical, prior fasting for blood work  Follow up plan: Return in about 3 months (around 02/16/2025) for annual exam, prior fasting for blood work.  Anton Blas, MD   "

## 2024-11-18 NOTE — Patient Instructions (Addendum)
 Goal fasting sugar is 80-120 Goal sugar 2 hours after meal is <180 Sugars are doing well!  Return in 3 months for physical, prior fasting for blood work

## 2024-11-18 NOTE — Assessment & Plan Note (Signed)
 Chronic, great control to date.  Continue low dose metformin  which is tolerated well.  He received cbg training by CMA today.  Diabetes associated with vascular disease, coronary disease, hyperlipidemia.

## 2025-02-08 ENCOUNTER — Other Ambulatory Visit

## 2025-02-15 ENCOUNTER — Encounter: Admitting: Family Medicine

## 2025-02-16 ENCOUNTER — Ambulatory Visit

## 2025-02-17 ENCOUNTER — Ambulatory Visit
